# Patient Record
Sex: Female | Born: 1965 | Race: Black or African American | Hispanic: No | Marital: Single | Smoking: Never smoker
Health system: Southern US, Community
[De-identification: ages and names within clinical notes are randomized; demographics above are authoritative.]

## PROBLEM LIST (undated history)

## (undated) DIAGNOSIS — E079 Disorder of thyroid, unspecified: Secondary | ICD-10-CM

## (undated) HISTORY — PX: THYROID SURGERY: SHX805

## (undated) HISTORY — PX: BRAIN SURGERY: SHX531

## (undated) HISTORY — PX: KNEE SURGERY: SHX244

---

## 2000-08-06 ENCOUNTER — Other Ambulatory Visit: Admission: RE | Admit: 2000-08-06 | Discharge: 2000-08-06 | Payer: Self-pay | Admitting: Internal Medicine

## 2000-08-15 ENCOUNTER — Encounter: Admission: RE | Admit: 2000-08-15 | Discharge: 2000-08-15 | Payer: Self-pay | Admitting: Internal Medicine

## 2000-08-15 ENCOUNTER — Encounter: Payer: Self-pay | Admitting: Internal Medicine

## 2001-08-12 ENCOUNTER — Encounter: Admission: RE | Admit: 2001-08-12 | Discharge: 2001-08-12 | Payer: Self-pay | Admitting: Internal Medicine

## 2001-08-12 ENCOUNTER — Encounter: Payer: Self-pay | Admitting: Internal Medicine

## 2001-08-29 ENCOUNTER — Encounter (INDEPENDENT_AMBULATORY_CARE_PROVIDER_SITE_OTHER): Payer: Self-pay | Admitting: *Deleted

## 2001-08-29 ENCOUNTER — Encounter: Payer: Self-pay | Admitting: Internal Medicine

## 2001-08-29 ENCOUNTER — Ambulatory Visit (HOSPITAL_COMMUNITY): Admission: RE | Admit: 2001-08-29 | Discharge: 2001-08-29 | Payer: Self-pay | Admitting: Internal Medicine

## 2005-01-27 ENCOUNTER — Other Ambulatory Visit: Admission: RE | Admit: 2005-01-27 | Discharge: 2005-01-27 | Payer: Self-pay | Admitting: Internal Medicine

## 2005-02-03 ENCOUNTER — Ambulatory Visit (HOSPITAL_COMMUNITY): Admission: RE | Admit: 2005-02-03 | Discharge: 2005-02-03 | Payer: Self-pay | Admitting: Internal Medicine

## 2005-09-20 ENCOUNTER — Encounter: Admission: RE | Admit: 2005-09-20 | Discharge: 2005-09-20 | Payer: Self-pay | Admitting: Family Medicine

## 2006-08-24 ENCOUNTER — Other Ambulatory Visit: Admission: RE | Admit: 2006-08-24 | Discharge: 2006-08-24 | Payer: Self-pay | Admitting: Obstetrics and Gynecology

## 2006-12-28 ENCOUNTER — Encounter: Admission: RE | Admit: 2006-12-28 | Discharge: 2006-12-28 | Payer: Self-pay | Admitting: Family Medicine

## 2007-06-28 ENCOUNTER — Encounter: Admission: RE | Admit: 2007-06-28 | Discharge: 2007-06-28 | Payer: Self-pay | Admitting: Family Medicine

## 2007-07-11 ENCOUNTER — Other Ambulatory Visit: Admission: RE | Admit: 2007-07-11 | Discharge: 2007-07-11 | Payer: Self-pay | Admitting: Interventional Radiology

## 2007-07-11 ENCOUNTER — Encounter (INDEPENDENT_AMBULATORY_CARE_PROVIDER_SITE_OTHER): Payer: Self-pay | Admitting: Interventional Radiology

## 2007-07-11 ENCOUNTER — Encounter: Admission: RE | Admit: 2007-07-11 | Discharge: 2007-07-11 | Payer: Self-pay | Admitting: Family Medicine

## 2007-10-08 ENCOUNTER — Encounter (HOSPITAL_COMMUNITY): Admission: RE | Admit: 2007-10-08 | Discharge: 2007-12-18 | Payer: Self-pay | Admitting: Internal Medicine

## 2008-01-30 ENCOUNTER — Ambulatory Visit (HOSPITAL_COMMUNITY): Admission: RE | Admit: 2008-01-30 | Discharge: 2008-01-31 | Payer: Self-pay | Admitting: Surgery

## 2008-01-30 ENCOUNTER — Encounter (INDEPENDENT_AMBULATORY_CARE_PROVIDER_SITE_OTHER): Payer: Self-pay | Admitting: Surgery

## 2010-06-17 ENCOUNTER — Other Ambulatory Visit: Payer: Self-pay | Admitting: Family Medicine

## 2010-06-17 ENCOUNTER — Other Ambulatory Visit: Payer: Self-pay

## 2010-06-17 ENCOUNTER — Other Ambulatory Visit (HOSPITAL_COMMUNITY)
Admission: RE | Admit: 2010-06-17 | Discharge: 2010-06-17 | Disposition: A | Discharge status: Home / Self Care | Payer: PRIVATE HEALTH INSURANCE | Source: Ambulatory Visit | Attending: Internal Medicine | Admitting: Internal Medicine

## 2010-06-17 DIAGNOSIS — Z1231 Encounter for screening mammogram for malignant neoplasm of breast: Secondary | ICD-10-CM

## 2010-06-17 DIAGNOSIS — Z01419 Encounter for gynecological examination (general) (routine) without abnormal findings: Secondary | ICD-10-CM | POA: Insufficient documentation

## 2010-06-24 ENCOUNTER — Ambulatory Visit
Admission: RE | Admit: 2010-06-24 | Discharge: 2010-06-24 | Disposition: A | Discharge status: Home / Self Care | Payer: PRIVATE HEALTH INSURANCE | Source: Ambulatory Visit | Attending: Family Medicine | Admitting: Family Medicine

## 2010-06-24 DIAGNOSIS — Z1231 Encounter for screening mammogram for malignant neoplasm of breast: Secondary | ICD-10-CM

## 2010-06-28 NOTE — Op Note (Signed)
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   PROCEDURE:  Left thyroid lobectomy.   SURGEON:  Velora Heckler, MD, FACS   ASSISTANT:  Leonie Man, MD   ANESTHESIA:  General per Dr. Laverle Hobby.   ESTIMATED BLOOD LOSS:  Minimal.   PREPARATION:  Betadine.   COMPLICATIONS:  None.   INDICATIONS:  The patient is a 45 year old black female who presents at  the request of Dr. Talmage Coin with left-sided thyroid nodule.  The  patient had initially been found to have a nodule in 2002.  Suppressive  doses of Synthroid were poorly tolerated and discontinued in 2005.  Ultrasound in May 2009 showed a 2.7-cm nodule in the left thyroid lobe.  Fine-needle aspiration biopsy showed Hurthle cell change.  Nuclear scan  showed a cold nodule in the left lobe.  The patient is referred to  Surgery for resection for definitive diagnosis.   BODY OF REPORT:  Procedure was done in OR #60 at the [facility].  The patient was brought to the operating room and  placed in the supine position on the operating room table.  Following  administration of general anesthesia, the patient was positioned and  then prepped and draped in the usual strict aseptic fashion.  After  ascertaining that an adequate level of anesthesia had been achieved, a  Kocher incision was performed with a #15 blade.  Dissection was carried  through subcutaneous tissues and platysma.  Hemostasis was obtained with  electrocautery.  Skin flaps were elevated cephalad and  caudad from the  thyroid notch to the sternal notch.  A Mahorner self-retaining retractor  was placed for exposure.  Strap muscles were incised in the midline and  dissection was carried down to the isthmus of the thyroid.  Strap  muscles were reflected to the left and the left thyroid lobe was  exposed.  Venous tributaries were divided between Ligaclips.  Gland was  gently mobilized.  Superior pole vessels were ligated in continuity with  2-0 silk ties and divided.  Smaller vessels were divided between  Ligaclips.  Gland was rolled anteriorly.  Inferior venous tributaries  were ligated with 2-0 silk ties and divided.  Gland was rolled  anteriorly.  Inferior parathyroid gland was identified and preserved on  its vascular pedicle.  Branches of the inferior thyroid artery were  divided between small Ligaclips.  Gland was rolled further anteriorly.  Ligament of Allyson Sabal was transected with electrocautery and the gland was  rolled up and onto the anterior surface of the trachea.  It was  mobilized across the midline.  Isthmus was transected at its junction  with the right thyroid lobe between hemostats.  Specimen was submitted  to Pathology for frozen section.  Dr. Pecola Leisure reviewed the specimen  and felt that this was a follicular lesion.  Hurthle cell changes noted.  No obvious malignancy was identified.   The cut edge of the right lobe of the thyroid was suture-ligated with 3-  0 Vicryl suture ligatures.  A small lymph node line just above the  isthmus was excised.  This was labeled as a Delphian lymph node and  submitted separately to pathology for review.  Right thyroid lobe was  examined and appeared grossly normal.  On palpation, there were no  dominant nor discrete masses.  There was no other lymphadenopathy.   Wound was irrigated.  Good hemostasis was noted.  Surgicel was placed in  the operative field.  Strap muscles were reapproximated in the midline  with interrupted 3-0 Vicryl  sutures.  Platysma was closed with  interrupted 3-0 Vicryl sutures.  Skin was closed with running 4-0  Monocryl subcuticular suture.  Wound was washed and dried.  Benzoin and  Steri-Strips were applied.  Sterile dressings were applied.  The patient  was awakened from anesthesia and brought to the recovery room in stable  condition.  The patient tolerated the procedure well.      Velora Heckler, MD  Electronically Signed     TMG/MEDQ  D:  01/30/2008  T:  01/30/2008  Job:  [id number]   cc:   Talmage Coin, M.D.  Lavonda Jumbo, M.D.

## 2010-11-18 LAB — URINALYSIS, ROUTINE W REFLEX MICROSCOPIC
Bilirubin Urine: NEGATIVE
Hgb urine dipstick: NEGATIVE
Nitrite: NEGATIVE
Protein, ur: NEGATIVE mg/dL
Specific Gravity, Urine: 1.02 (ref 1.005–1.030)
Urobilinogen, UA: 0.2 mg/dL (ref 0.0–1.0)

## 2010-11-18 LAB — DIFFERENTIAL
Band Neutrophils: 0 % (ref 0–10)
Blasts: 0 %
Eosinophils Absolute: 0.1 10*3/uL (ref 0.0–0.7)
Eosinophils Relative: 1 % (ref 0–5)
Metamyelocytes Relative: 0 %
Monocytes Absolute: 0.3 10*3/uL (ref 0.1–1.0)
Myelocytes: 0 %
nRBC: 0 /100 WBC

## 2010-11-18 LAB — COMPREHENSIVE METABOLIC PANEL
ALT: 24 U/L (ref 0–35)
AST: 25 U/L (ref 0–37)
Alkaline Phosphatase: 85 U/L (ref 39–117)
CO2: 26 mEq/L (ref 19–32)
GFR calc Af Amer: >60 mL/min (ref >60)
GFR calc non Af Amer: >60 mL/min (ref >60)
Glucose, Bld: 99 mg/dL (ref 70–99)
Potassium: 3.8 mEq/L (ref 3.5–5.1)
Sodium: 139 mEq/L (ref 135–145)

## 2010-11-18 LAB — HCG, SERUM, QUALITATIVE: Preg, Serum: NEGATIVE

## 2010-11-18 LAB — CBC
Hemoglobin: 12.8 g/dL (ref 12.0–15.0)
RBC: 4.68 MIL/uL (ref 3.87–5.11)
WBC: 4.9 10*3/uL (ref 4.0–10.5)

## 2010-11-18 LAB — PROTIME-INR
INR: 1 (ref 0.00–1.49)
Prothrombin Time: 13.3 seconds (ref 11.6–15.2)

## 2013-08-19 ENCOUNTER — Ambulatory Visit: Payer: PRIVATE HEALTH INSURANCE | Admitting: Internal Medicine

## 2013-11-17 ENCOUNTER — Emergency Department: Payer: Self-pay | Admitting: Emergency Medicine

## 2013-11-17 LAB — COMPREHENSIVE METABOLIC PANEL
ALT: 18 U/L
AST: 17 U/L (ref 15–37)
Albumin: 3.7 g/dL (ref 3.4–5.0)
Alkaline Phosphatase: 82 U/L
Anion Gap: 7 (ref 7–16)
BILIRUBIN TOTAL: 0.2 mg/dL (ref 0.2–1.0)
BUN: 9 mg/dL (ref 7–18)
CHLORIDE: 105 mmol/L (ref 98–107)
CO2: 25 mmol/L (ref 21–32)
Calcium, Total: 8.4 mg/dL — ABNORMAL LOW (ref 8.5–10.1)
Creatinine: 0.84 mg/dL (ref 0.60–1.30)
EGFR (African American): >60
EGFR (Non-African Amer.): >60
Glucose: 115 mg/dL — ABNORMAL HIGH (ref 65–99)
Osmolality: 273 (ref 275–301)
POTASSIUM: 3.5 mmol/L (ref 3.5–5.1)
SODIUM: 137 mmol/L (ref 136–145)
TOTAL PROTEIN: 7.3 g/dL (ref 6.4–8.2)

## 2013-11-17 LAB — CK TOTAL AND CKMB (NOT AT ARMC)
CK, Total: 102 U/L
CK-MB: 0.7 ng/mL (ref 0.5–3.6)

## 2013-11-17 LAB — CBC
HCT: 39.1 % (ref 35.0–47.0)
HGB: 12.4 g/dL (ref 12.0–16.0)
MCH: 26.6 pg (ref 26.0–34.0)
MCHC: 31.9 g/dL — ABNORMAL LOW (ref 32.0–36.0)
MCV: 84 fL (ref 80–100)
Platelet: 346 10*3/uL (ref 150–440)
RBC: 4.68 10*6/uL (ref 3.80–5.20)
RDW: 14.9 % — AB (ref 11.5–14.5)
WBC: 10.5 10*3/uL (ref 3.6–11.0)

## 2013-11-17 LAB — LIPASE, BLOOD: LIPASE: 86 U/L (ref 73–393)

## 2013-11-17 LAB — TROPONIN I

## 2013-11-18 LAB — URINALYSIS, COMPLETE
Bacteria: NONE SEEN
Bilirubin,UR: NEGATIVE
Blood: NEGATIVE
GLUCOSE, UR: NEGATIVE mg/dL (ref 0–75)
Leukocyte Esterase: NEGATIVE
Nitrite: NEGATIVE
Ph: 5 (ref 4.5–8.0)
SPECIFIC GRAVITY: 1.021 (ref 1.003–1.030)

## 2013-11-18 LAB — TROPONIN I: Troponin-I: <0.02 ng/mL

## 2015-07-30 DIAGNOSIS — M12271 Villonodular synovitis (pigmented), right ankle and foot: Secondary | ICD-10-CM | POA: Diagnosis not present

## 2015-07-30 DIAGNOSIS — M65871 Other synovitis and tenosynovitis, right ankle and foot: Secondary | ICD-10-CM | POA: Diagnosis not present

## 2015-07-30 DIAGNOSIS — M25571 Pain in right ankle and joints of right foot: Secondary | ICD-10-CM | POA: Diagnosis not present

## 2015-07-30 DIAGNOSIS — M79671 Pain in right foot: Secondary | ICD-10-CM | POA: Diagnosis not present

## 2015-08-13 DIAGNOSIS — M65871 Other synovitis and tenosynovitis, right ankle and foot: Secondary | ICD-10-CM | POA: Diagnosis not present

## 2015-08-13 DIAGNOSIS — M25571 Pain in right ankle and joints of right foot: Secondary | ICD-10-CM | POA: Diagnosis not present

## 2015-09-03 DIAGNOSIS — M12271 Villonodular synovitis (pigmented), right ankle and foot: Secondary | ICD-10-CM | POA: Diagnosis not present

## 2015-09-03 DIAGNOSIS — M65871 Other synovitis and tenosynovitis, right ankle and foot: Secondary | ICD-10-CM | POA: Diagnosis not present

## 2015-09-14 DIAGNOSIS — S83242A Other tear of medial meniscus, current injury, left knee, initial encounter: Secondary | ICD-10-CM | POA: Diagnosis not present

## 2015-09-14 DIAGNOSIS — M25562 Pain in left knee: Secondary | ICD-10-CM | POA: Diagnosis not present

## 2015-11-22 DIAGNOSIS — J029 Acute pharyngitis, unspecified: Secondary | ICD-10-CM | POA: Diagnosis not present

## 2015-12-23 DIAGNOSIS — M25561 Pain in right knee: Secondary | ICD-10-CM | POA: Diagnosis not present

## 2015-12-24 DIAGNOSIS — E559 Vitamin D deficiency, unspecified: Secondary | ICD-10-CM | POA: Diagnosis not present

## 2015-12-24 DIAGNOSIS — K219 Gastro-esophageal reflux disease without esophagitis: Secondary | ICD-10-CM | POA: Diagnosis not present

## 2015-12-24 DIAGNOSIS — J3089 Other allergic rhinitis: Secondary | ICD-10-CM | POA: Diagnosis not present

## 2015-12-24 DIAGNOSIS — D473 Essential (hemorrhagic) thrombocythemia: Secondary | ICD-10-CM | POA: Diagnosis not present

## 2015-12-24 DIAGNOSIS — Z8639 Personal history of other endocrine, nutritional and metabolic disease: Secondary | ICD-10-CM | POA: Diagnosis not present

## 2015-12-30 DIAGNOSIS — M1711 Unilateral primary osteoarthritis, right knee: Secondary | ICD-10-CM | POA: Diagnosis not present

## 2015-12-30 DIAGNOSIS — M25561 Pain in right knee: Secondary | ICD-10-CM | POA: Diagnosis not present

## 2015-12-30 DIAGNOSIS — M25461 Effusion, right knee: Secondary | ICD-10-CM | POA: Diagnosis not present

## 2016-01-20 DIAGNOSIS — M25561 Pain in right knee: Secondary | ICD-10-CM | POA: Diagnosis not present

## 2016-02-09 DIAGNOSIS — M1711 Unilateral primary osteoarthritis, right knee: Secondary | ICD-10-CM | POA: Diagnosis not present

## 2016-02-09 DIAGNOSIS — M25561 Pain in right knee: Secondary | ICD-10-CM | POA: Diagnosis not present

## 2016-02-25 DIAGNOSIS — B9789 Other viral agents as the cause of diseases classified elsewhere: Secondary | ICD-10-CM | POA: Diagnosis not present

## 2016-02-25 DIAGNOSIS — J040 Acute laryngitis: Secondary | ICD-10-CM | POA: Diagnosis not present

## 2016-02-29 ENCOUNTER — Emergency Department (HOSPITAL_COMMUNITY)
Admission: EM | Admit: 2016-02-29 | Discharge: 2016-02-29 | Disposition: A | Discharge status: Home / Self Care | Payer: BLUE CROSS/BLUE SHIELD | Attending: Emergency Medicine | Admitting: Emergency Medicine

## 2016-02-29 ENCOUNTER — Encounter (HOSPITAL_COMMUNITY): Payer: Self-pay

## 2016-02-29 ENCOUNTER — Emergency Department (HOSPITAL_COMMUNITY): Payer: BLUE CROSS/BLUE SHIELD

## 2016-02-29 DIAGNOSIS — J069 Acute upper respiratory infection, unspecified: Secondary | ICD-10-CM | POA: Insufficient documentation

## 2016-02-29 DIAGNOSIS — B9789 Other viral agents as the cause of diseases classified elsewhere: Secondary | ICD-10-CM

## 2016-02-29 DIAGNOSIS — R05 Cough: Secondary | ICD-10-CM | POA: Diagnosis not present

## 2016-02-29 HISTORY — DX: Disorder of thyroid, unspecified: E07.9

## 2016-02-29 MED ORDER — HYDROCOD POLST-CPM POLST ER 10-8 MG/5ML PO SUER: 5.0000 mL | Freq: Every evening | ORAL | 0 refills | Status: DC | PRN

## 2016-02-29 MED ORDER — IPRATROPIUM-ALBUTEROL 0.5-2.5 (3) MG/3ML IN SOLN
3.0000 mL | Freq: Once | RESPIRATORY_TRACT | Status: AC
  Administered 2016-02-29: 3 mL via RESPIRATORY_TRACT
  Filled 2016-02-29: qty 3

## 2016-02-29 NOTE — ED Provider Notes (Signed)
[facility]EMERGENCY DEPT Provider Note   CSN: [id number] Arrival date & time: 02/29/16  6578 By signing my name below, I, Bridgette Habermann, attest that this documentation has been prepared under the direction and in the presence of Zadie Rhine, MD. Electronically Signed: Bridgette Habermann, ED Scribe. 02/29/16. 3:56 AM.  History   Chief Complaint Chief Complaint  Patient presents with  . Cough    HPI  The history is provided by the patient. No language interpreter was used.  Cough  This is a new problem. The current episode started more than 1 week ago. The problem occurs constantly. The problem has not changed since onset.The cough is productive of sputum. There has been no fever. Associated symptoms include sore throat. Pertinent negatives include no chest pain. She has tried cough syrup for the symptoms. The treatment provided no relief. She is not a smoker.    HPI Comments: Kendra [name] is a 51 y.o. female with no pertinent PMHx, who presents to the Emergency Department complaining of productive cough onset one week ago with associated sore throat. Pt reports she was seen by her PCP and diagnosed with viral laryngitis. She was prescribed Tessalon and Prednisone with no relief to her symptoms. Pt is not a smoker and does not drink alcohol. Pt denies fever, chills, hemoptysis, vomiting, chest pain, leg swelling, abdominal pain, or any other associated symptoms.    Past Medical History:  Diagnosis Date  . Thyroid disease     There are no active problems to display for this patient.   History reviewed. No pertinent surgical history.  OB History    No data available       Home Medications    Prior to Admission medications   Not on File    Family History History reviewed. No pertinent family history.  Social History Social History  Substance Use Topics  . Smoking status: Never Smoker  . Smokeless tobacco: Never Used  . Alcohol use No     Allergies   Tea   Review of  Systems Review of Systems  HENT: Positive for sore throat.   Respiratory: Positive for cough.   Cardiovascular: Negative for chest pain.  All other systems reviewed and are negative.   Physical Exam Updated Vital Signs BP 139/86   Pulse 93   Temp 98.3 F (36.8 C) (Oral)   Resp 16   LMP 11/29/2015 (Approximate)   SpO2 99%   Physical Exam CONSTITUTIONAL: Well developed/well nourished HEAD: Normocephalic/atraumatic EYES: EOMI/PERRL ENMT: Mucous membranes moist. Uvula midline, no erythema or exudates noted. NECK: supple no meningeal signs SPINE/BACK:entire spine nontender CV: S1/S2 noted, no murmurs/rubs/gallops noted LUNGS: Lungs are clear to auscultation bilaterally, no apparent distress. Pt coughs frequently on exam. ABDOMEN: soft, nontender GU:no cva tenderness NEURO: Pt is awake/alert/appropriate, moves all extremitiesx4.  No facial droop.   EXTREMITIES: pulses normal/equal, full ROM SKIN: warm, color normal PSYCH: no abnormalities of mood noted, alert and oriented to situation  ED Treatments / Results  DIAGNOSTIC STUDIES: Oxygen Saturation is 99% on RA, normal by my interpretation.    COORDINATION OF CARE: 3:53 AM Discussed treatment plan with pt at bedside which includes x-ray and breathing treatment and pt agreed to plan.  Labs (all labs ordered are listed, but only abnormal results are displayed) Labs Reviewed - No data to display  EKG  EKG Interpretation None       Radiology Dg Chest 2 View  Result Date: 02/29/2016 CLINICAL DATA:  Cough for 2 weeks. EXAM: CHEST  2 VIEW COMPARISON:  11/17/2013 FINDINGS: Low lung volumes with bibasilar atelectasis or scarring. No focal consolidation to suggest pneumonia. Normal heart size and mediastinal contours. No pleural fluid or pneumothorax. No acute osseous abnormality. IMPRESSION: Low lung volumes with bibasilar atelectasis or scarring. Electronically Signed   By: Rubye OaksMelanie  Ehinger M.D.   On: 02/29/2016 04:29     Procedures Procedures   Medications Ordered in ED Medications  ipratropium-albuterol (DUONEB) 0.5-2.5 (3) MG/3ML nebulizer solution 3 mL (3 mLs Nebulization Given 02/29/16 0406)     Initial Impression / Assessment and Plan / ED Course  I have reviewed the triage vital signs and the nursing notes.  Pertinent imaging results that were available during my care of the patient were reviewed by me and considered in my medical decision making (see chart for details).  Clinical Course    5:14 AM Pt well appearing No hypoxia CXR negative She coughs frequently but otherwise well appearing No distress noted Suspect prolonged viral URI with cough Stop tessalon, start tussionex Narcotic database reviewed and considered in decision making She will see PCP later this week if cough continues We discussed return precautions   Final Clinical Impressions(s) / ED Diagnoses   Final diagnoses:  Viral URI with cough    New Prescriptions New Prescriptions   CHLORPHENIRAMINE-HYDROCODONE (TUSSIONEX PENNKINETIC ER) 10-8 MG/5ML SUER    Take 5 mLs by mouth at bedtime as needed for cough.   I personally performed the services described in this documentation, which was scribed in my presence. The recorded information has been reviewed and is accurate.        Zadie Rhineonald Dawud Mays, MD 02/29/16 [phone]

## 2016-02-29 NOTE — ED Triage Notes (Signed)
Pt states coughing x 1 week. Pt states seen by PCP dx with viral laryngitis. Pt states rx for tessalon. Pt states cough continued, no relief. Pt denies any fevers or chills.

## 2016-03-03 DIAGNOSIS — J019 Acute sinusitis, unspecified: Secondary | ICD-10-CM | POA: Diagnosis not present

## 2016-03-03 DIAGNOSIS — B9689 Other specified bacterial agents as the cause of diseases classified elsewhere: Secondary | ICD-10-CM | POA: Diagnosis not present

## 2016-03-21 DIAGNOSIS — M1711 Unilateral primary osteoarthritis, right knee: Secondary | ICD-10-CM | POA: Diagnosis not present

## 2016-03-28 DIAGNOSIS — M1711 Unilateral primary osteoarthritis, right knee: Secondary | ICD-10-CM | POA: Diagnosis not present

## 2016-04-05 DIAGNOSIS — M1711 Unilateral primary osteoarthritis, right knee: Secondary | ICD-10-CM | POA: Diagnosis not present

## 2016-05-04 DIAGNOSIS — J3081 Allergic rhinitis due to animal (cat) (dog) hair and dander: Secondary | ICD-10-CM | POA: Diagnosis not present

## 2016-05-04 DIAGNOSIS — J301 Allergic rhinitis due to pollen: Secondary | ICD-10-CM | POA: Diagnosis not present

## 2016-05-04 DIAGNOSIS — J3089 Other allergic rhinitis: Secondary | ICD-10-CM | POA: Diagnosis not present

## 2016-05-04 DIAGNOSIS — R05 Cough: Secondary | ICD-10-CM | POA: Diagnosis not present

## 2016-05-10 DIAGNOSIS — J301 Allergic rhinitis due to pollen: Secondary | ICD-10-CM | POA: Diagnosis not present

## 2016-05-10 DIAGNOSIS — J3081 Allergic rhinitis due to animal (cat) (dog) hair and dander: Secondary | ICD-10-CM | POA: Diagnosis not present

## 2016-05-11 DIAGNOSIS — J3089 Other allergic rhinitis: Secondary | ICD-10-CM | POA: Diagnosis not present

## 2016-05-26 DIAGNOSIS — L659 Nonscarring hair loss, unspecified: Secondary | ICD-10-CM | POA: Diagnosis not present

## 2016-05-26 DIAGNOSIS — L259 Unspecified contact dermatitis, unspecified cause: Secondary | ICD-10-CM | POA: Diagnosis not present

## 2016-06-21 DIAGNOSIS — J301 Allergic rhinitis due to pollen: Secondary | ICD-10-CM | POA: Diagnosis not present

## 2016-06-21 DIAGNOSIS — J3089 Other allergic rhinitis: Secondary | ICD-10-CM | POA: Diagnosis not present

## 2016-06-21 DIAGNOSIS — J3081 Allergic rhinitis due to animal (cat) (dog) hair and dander: Secondary | ICD-10-CM | POA: Diagnosis not present

## 2016-06-28 DIAGNOSIS — J301 Allergic rhinitis due to pollen: Secondary | ICD-10-CM | POA: Diagnosis not present

## 2016-06-28 DIAGNOSIS — J3081 Allergic rhinitis due to animal (cat) (dog) hair and dander: Secondary | ICD-10-CM | POA: Diagnosis not present

## 2016-06-28 DIAGNOSIS — J3089 Other allergic rhinitis: Secondary | ICD-10-CM | POA: Diagnosis not present

## 2016-07-05 DIAGNOSIS — J3089 Other allergic rhinitis: Secondary | ICD-10-CM | POA: Diagnosis not present

## 2016-07-05 DIAGNOSIS — J3081 Allergic rhinitis due to animal (cat) (dog) hair and dander: Secondary | ICD-10-CM | POA: Diagnosis not present

## 2016-07-05 DIAGNOSIS — J301 Allergic rhinitis due to pollen: Secondary | ICD-10-CM | POA: Diagnosis not present

## 2016-07-11 DIAGNOSIS — M1711 Unilateral primary osteoarthritis, right knee: Secondary | ICD-10-CM | POA: Diagnosis not present

## 2016-07-12 DIAGNOSIS — J3081 Allergic rhinitis due to animal (cat) (dog) hair and dander: Secondary | ICD-10-CM | POA: Diagnosis not present

## 2016-07-12 DIAGNOSIS — J3089 Other allergic rhinitis: Secondary | ICD-10-CM | POA: Diagnosis not present

## 2016-07-12 DIAGNOSIS — J301 Allergic rhinitis due to pollen: Secondary | ICD-10-CM | POA: Diagnosis not present

## 2016-07-26 DIAGNOSIS — J3081 Allergic rhinitis due to animal (cat) (dog) hair and dander: Secondary | ICD-10-CM | POA: Diagnosis not present

## 2016-07-26 DIAGNOSIS — J3089 Other allergic rhinitis: Secondary | ICD-10-CM | POA: Diagnosis not present

## 2016-07-26 DIAGNOSIS — J301 Allergic rhinitis due to pollen: Secondary | ICD-10-CM | POA: Diagnosis not present

## 2016-08-02 DIAGNOSIS — J301 Allergic rhinitis due to pollen: Secondary | ICD-10-CM | POA: Diagnosis not present

## 2016-08-02 DIAGNOSIS — J3081 Allergic rhinitis due to animal (cat) (dog) hair and dander: Secondary | ICD-10-CM | POA: Diagnosis not present

## 2016-08-02 DIAGNOSIS — J3089 Other allergic rhinitis: Secondary | ICD-10-CM | POA: Diagnosis not present

## 2016-08-09 DIAGNOSIS — J3089 Other allergic rhinitis: Secondary | ICD-10-CM | POA: Diagnosis not present

## 2016-08-09 DIAGNOSIS — J301 Allergic rhinitis due to pollen: Secondary | ICD-10-CM | POA: Diagnosis not present

## 2016-08-09 DIAGNOSIS — J3081 Allergic rhinitis due to animal (cat) (dog) hair and dander: Secondary | ICD-10-CM | POA: Diagnosis not present

## 2016-08-23 DIAGNOSIS — J3089 Other allergic rhinitis: Secondary | ICD-10-CM | POA: Diagnosis not present

## 2016-08-23 DIAGNOSIS — J301 Allergic rhinitis due to pollen: Secondary | ICD-10-CM | POA: Diagnosis not present

## 2016-08-23 DIAGNOSIS — J3081 Allergic rhinitis due to animal (cat) (dog) hair and dander: Secondary | ICD-10-CM | POA: Diagnosis not present

## 2016-08-30 DIAGNOSIS — J301 Allergic rhinitis due to pollen: Secondary | ICD-10-CM | POA: Diagnosis not present

## 2016-08-30 DIAGNOSIS — J3089 Other allergic rhinitis: Secondary | ICD-10-CM | POA: Diagnosis not present

## 2016-08-30 DIAGNOSIS — J3081 Allergic rhinitis due to animal (cat) (dog) hair and dander: Secondary | ICD-10-CM | POA: Diagnosis not present

## 2016-09-01 DIAGNOSIS — D473 Essential (hemorrhagic) thrombocythemia: Secondary | ICD-10-CM | POA: Diagnosis not present

## 2016-09-01 DIAGNOSIS — Z8639 Personal history of other endocrine, nutritional and metabolic disease: Secondary | ICD-10-CM | POA: Diagnosis not present

## 2016-09-01 DIAGNOSIS — K219 Gastro-esophageal reflux disease without esophagitis: Secondary | ICD-10-CM | POA: Diagnosis not present

## 2016-09-01 DIAGNOSIS — R011 Cardiac murmur, unspecified: Secondary | ICD-10-CM | POA: Diagnosis not present

## 2016-09-01 DIAGNOSIS — E559 Vitamin D deficiency, unspecified: Secondary | ICD-10-CM | POA: Diagnosis not present

## 2016-09-13 DIAGNOSIS — J301 Allergic rhinitis due to pollen: Secondary | ICD-10-CM | POA: Diagnosis not present

## 2016-09-13 DIAGNOSIS — J3089 Other allergic rhinitis: Secondary | ICD-10-CM | POA: Diagnosis not present

## 2016-09-13 DIAGNOSIS — J3081 Allergic rhinitis due to animal (cat) (dog) hair and dander: Secondary | ICD-10-CM | POA: Diagnosis not present

## 2016-09-20 DIAGNOSIS — J3081 Allergic rhinitis due to animal (cat) (dog) hair and dander: Secondary | ICD-10-CM | POA: Diagnosis not present

## 2016-09-20 DIAGNOSIS — J3089 Other allergic rhinitis: Secondary | ICD-10-CM | POA: Diagnosis not present

## 2016-09-20 DIAGNOSIS — J301 Allergic rhinitis due to pollen: Secondary | ICD-10-CM | POA: Diagnosis not present

## 2016-09-27 DIAGNOSIS — J3089 Other allergic rhinitis: Secondary | ICD-10-CM | POA: Diagnosis not present

## 2016-09-27 DIAGNOSIS — J301 Allergic rhinitis due to pollen: Secondary | ICD-10-CM | POA: Diagnosis not present

## 2016-09-27 DIAGNOSIS — J3081 Allergic rhinitis due to animal (cat) (dog) hair and dander: Secondary | ICD-10-CM | POA: Diagnosis not present

## 2016-10-04 DIAGNOSIS — J301 Allergic rhinitis due to pollen: Secondary | ICD-10-CM | POA: Diagnosis not present

## 2016-10-04 DIAGNOSIS — J3081 Allergic rhinitis due to animal (cat) (dog) hair and dander: Secondary | ICD-10-CM | POA: Diagnosis not present

## 2016-10-04 DIAGNOSIS — J3089 Other allergic rhinitis: Secondary | ICD-10-CM | POA: Diagnosis not present

## 2016-10-11 DIAGNOSIS — M1711 Unilateral primary osteoarthritis, right knee: Secondary | ICD-10-CM | POA: Diagnosis not present

## 2016-10-18 DIAGNOSIS — J3081 Allergic rhinitis due to animal (cat) (dog) hair and dander: Secondary | ICD-10-CM | POA: Diagnosis not present

## 2016-10-18 DIAGNOSIS — J3089 Other allergic rhinitis: Secondary | ICD-10-CM | POA: Diagnosis not present

## 2016-10-18 DIAGNOSIS — J301 Allergic rhinitis due to pollen: Secondary | ICD-10-CM | POA: Diagnosis not present

## 2016-10-25 DIAGNOSIS — J301 Allergic rhinitis due to pollen: Secondary | ICD-10-CM | POA: Diagnosis not present

## 2016-10-25 DIAGNOSIS — J3081 Allergic rhinitis due to animal (cat) (dog) hair and dander: Secondary | ICD-10-CM | POA: Diagnosis not present

## 2016-10-25 DIAGNOSIS — J3089 Other allergic rhinitis: Secondary | ICD-10-CM | POA: Diagnosis not present

## 2016-11-01 DIAGNOSIS — J3089 Other allergic rhinitis: Secondary | ICD-10-CM | POA: Diagnosis not present

## 2016-11-01 DIAGNOSIS — J301 Allergic rhinitis due to pollen: Secondary | ICD-10-CM | POA: Diagnosis not present

## 2016-11-01 DIAGNOSIS — J3081 Allergic rhinitis due to animal (cat) (dog) hair and dander: Secondary | ICD-10-CM | POA: Diagnosis not present

## 2016-11-15 DIAGNOSIS — J3081 Allergic rhinitis due to animal (cat) (dog) hair and dander: Secondary | ICD-10-CM | POA: Diagnosis not present

## 2016-11-15 DIAGNOSIS — J3089 Other allergic rhinitis: Secondary | ICD-10-CM | POA: Diagnosis not present

## 2016-11-15 DIAGNOSIS — J301 Allergic rhinitis due to pollen: Secondary | ICD-10-CM | POA: Diagnosis not present

## 2016-11-24 DIAGNOSIS — M1711 Unilateral primary osteoarthritis, right knee: Secondary | ICD-10-CM | POA: Diagnosis not present

## 2016-11-24 DIAGNOSIS — M25561 Pain in right knee: Secondary | ICD-10-CM | POA: Diagnosis not present

## 2016-12-19 DIAGNOSIS — J3089 Other allergic rhinitis: Secondary | ICD-10-CM | POA: Diagnosis not present

## 2016-12-19 DIAGNOSIS — R05 Cough: Secondary | ICD-10-CM | POA: Diagnosis not present

## 2016-12-19 DIAGNOSIS — J301 Allergic rhinitis due to pollen: Secondary | ICD-10-CM | POA: Diagnosis not present

## 2016-12-19 DIAGNOSIS — J3081 Allergic rhinitis due to animal (cat) (dog) hair and dander: Secondary | ICD-10-CM | POA: Diagnosis not present

## 2016-12-21 DIAGNOSIS — M1711 Unilateral primary osteoarthritis, right knee: Secondary | ICD-10-CM | POA: Diagnosis not present

## 2016-12-22 DIAGNOSIS — Z471 Aftercare following joint replacement surgery: Secondary | ICD-10-CM | POA: Diagnosis not present

## 2016-12-22 DIAGNOSIS — M25561 Pain in right knee: Secondary | ICD-10-CM | POA: Diagnosis not present

## 2016-12-22 DIAGNOSIS — M1711 Unilateral primary osteoarthritis, right knee: Secondary | ICD-10-CM | POA: Diagnosis not present

## 2016-12-25 DIAGNOSIS — M1711 Unilateral primary osteoarthritis, right knee: Secondary | ICD-10-CM | POA: Diagnosis not present

## 2016-12-25 DIAGNOSIS — M25561 Pain in right knee: Secondary | ICD-10-CM | POA: Diagnosis not present

## 2016-12-25 DIAGNOSIS — Z471 Aftercare following joint replacement surgery: Secondary | ICD-10-CM | POA: Diagnosis not present

## 2016-12-27 DIAGNOSIS — Z471 Aftercare following joint replacement surgery: Secondary | ICD-10-CM | POA: Diagnosis not present

## 2016-12-27 DIAGNOSIS — M1711 Unilateral primary osteoarthritis, right knee: Secondary | ICD-10-CM | POA: Diagnosis not present

## 2016-12-27 DIAGNOSIS — M25561 Pain in right knee: Secondary | ICD-10-CM | POA: Diagnosis not present

## 2016-12-29 DIAGNOSIS — M1711 Unilateral primary osteoarthritis, right knee: Secondary | ICD-10-CM | POA: Diagnosis not present

## 2017-01-01 DIAGNOSIS — M1711 Unilateral primary osteoarthritis, right knee: Secondary | ICD-10-CM | POA: Diagnosis not present

## 2017-01-03 DIAGNOSIS — M1711 Unilateral primary osteoarthritis, right knee: Secondary | ICD-10-CM | POA: Diagnosis not present

## 2017-01-09 DIAGNOSIS — M1711 Unilateral primary osteoarthritis, right knee: Secondary | ICD-10-CM | POA: Diagnosis not present

## 2017-01-15 DIAGNOSIS — M1711 Unilateral primary osteoarthritis, right knee: Secondary | ICD-10-CM | POA: Diagnosis not present

## 2017-01-18 DIAGNOSIS — M1711 Unilateral primary osteoarthritis, right knee: Secondary | ICD-10-CM | POA: Diagnosis not present

## 2017-01-27 DIAGNOSIS — M1711 Unilateral primary osteoarthritis, right knee: Secondary | ICD-10-CM | POA: Diagnosis not present

## 2017-01-27 DIAGNOSIS — M25561 Pain in right knee: Secondary | ICD-10-CM | POA: Diagnosis not present

## 2017-01-31 DIAGNOSIS — Z96651 Presence of right artificial knee joint: Secondary | ICD-10-CM | POA: Diagnosis not present

## 2017-02-05 DIAGNOSIS — Z96651 Presence of right artificial knee joint: Secondary | ICD-10-CM | POA: Diagnosis not present

## 2017-03-06 DIAGNOSIS — J060 Acute laryngopharyngitis: Secondary | ICD-10-CM | POA: Diagnosis not present

## 2017-03-06 DIAGNOSIS — R509 Fever, unspecified: Secondary | ICD-10-CM | POA: Diagnosis not present

## 2017-03-15 ENCOUNTER — Ambulatory Visit
Admission: RE | Admit: 2017-03-15 | Discharge: 2017-03-15 | Disposition: A | Discharge status: Home / Self Care | Payer: BLUE CROSS/BLUE SHIELD | Source: Ambulatory Visit | Attending: Nurse Practitioner | Admitting: Nurse Practitioner

## 2017-03-15 ENCOUNTER — Other Ambulatory Visit: Payer: Self-pay | Admitting: Nurse Practitioner

## 2017-03-15 DIAGNOSIS — J209 Acute bronchitis, unspecified: Secondary | ICD-10-CM | POA: Diagnosis not present

## 2017-03-15 DIAGNOSIS — R509 Fever, unspecified: Secondary | ICD-10-CM | POA: Diagnosis not present

## 2017-03-15 DIAGNOSIS — J208 Acute bronchitis due to other specified organisms: Secondary | ICD-10-CM

## 2017-03-15 DIAGNOSIS — R05 Cough: Secondary | ICD-10-CM | POA: Diagnosis not present

## 2017-03-15 DIAGNOSIS — J060 Acute laryngopharyngitis: Secondary | ICD-10-CM | POA: Diagnosis not present

## 2017-03-23 DIAGNOSIS — J209 Acute bronchitis, unspecified: Secondary | ICD-10-CM | POA: Diagnosis not present

## 2017-04-03 DIAGNOSIS — R05 Cough: Secondary | ICD-10-CM | POA: Diagnosis not present

## 2017-04-03 DIAGNOSIS — J069 Acute upper respiratory infection, unspecified: Secondary | ICD-10-CM | POA: Diagnosis not present

## 2017-05-18 DIAGNOSIS — Z96651 Presence of right artificial knee joint: Secondary | ICD-10-CM | POA: Diagnosis not present

## 2017-07-20 DIAGNOSIS — M7581 Other shoulder lesions, right shoulder: Secondary | ICD-10-CM | POA: Diagnosis not present

## 2017-08-08 DIAGNOSIS — M5412 Radiculopathy, cervical region: Secondary | ICD-10-CM | POA: Diagnosis not present

## 2017-08-23 DIAGNOSIS — M25511 Pain in right shoulder: Secondary | ICD-10-CM | POA: Diagnosis not present

## 2017-08-23 DIAGNOSIS — M542 Cervicalgia: Secondary | ICD-10-CM | POA: Diagnosis not present

## 2017-08-23 DIAGNOSIS — M546 Pain in thoracic spine: Secondary | ICD-10-CM | POA: Diagnosis not present

## 2017-08-23 DIAGNOSIS — M5412 Radiculopathy, cervical region: Secondary | ICD-10-CM | POA: Diagnosis not present

## 2017-08-24 DIAGNOSIS — M25511 Pain in right shoulder: Secondary | ICD-10-CM | POA: Diagnosis not present

## 2017-08-24 DIAGNOSIS — M546 Pain in thoracic spine: Secondary | ICD-10-CM | POA: Diagnosis not present

## 2017-08-24 DIAGNOSIS — M542 Cervicalgia: Secondary | ICD-10-CM | POA: Diagnosis not present

## 2017-08-24 DIAGNOSIS — M5412 Radiculopathy, cervical region: Secondary | ICD-10-CM | POA: Diagnosis not present

## 2017-08-27 DIAGNOSIS — M25511 Pain in right shoulder: Secondary | ICD-10-CM | POA: Diagnosis not present

## 2017-08-27 DIAGNOSIS — M546 Pain in thoracic spine: Secondary | ICD-10-CM | POA: Diagnosis not present

## 2017-08-27 DIAGNOSIS — M5412 Radiculopathy, cervical region: Secondary | ICD-10-CM | POA: Diagnosis not present

## 2017-08-27 DIAGNOSIS — M542 Cervicalgia: Secondary | ICD-10-CM | POA: Diagnosis not present

## 2017-08-29 DIAGNOSIS — M5412 Radiculopathy, cervical region: Secondary | ICD-10-CM | POA: Diagnosis not present

## 2017-08-29 DIAGNOSIS — M25511 Pain in right shoulder: Secondary | ICD-10-CM | POA: Diagnosis not present

## 2017-08-29 DIAGNOSIS — M542 Cervicalgia: Secondary | ICD-10-CM | POA: Diagnosis not present

## 2017-08-29 DIAGNOSIS — M546 Pain in thoracic spine: Secondary | ICD-10-CM | POA: Diagnosis not present

## 2017-08-31 DIAGNOSIS — M25511 Pain in right shoulder: Secondary | ICD-10-CM | POA: Diagnosis not present

## 2017-08-31 DIAGNOSIS — M5412 Radiculopathy, cervical region: Secondary | ICD-10-CM | POA: Diagnosis not present

## 2017-08-31 DIAGNOSIS — M542 Cervicalgia: Secondary | ICD-10-CM | POA: Diagnosis not present

## 2017-08-31 DIAGNOSIS — M546 Pain in thoracic spine: Secondary | ICD-10-CM | POA: Diagnosis not present

## 2017-09-03 DIAGNOSIS — M25511 Pain in right shoulder: Secondary | ICD-10-CM | POA: Diagnosis not present

## 2017-09-03 DIAGNOSIS — M5412 Radiculopathy, cervical region: Secondary | ICD-10-CM | POA: Diagnosis not present

## 2017-09-03 DIAGNOSIS — M542 Cervicalgia: Secondary | ICD-10-CM | POA: Diagnosis not present

## 2017-09-03 DIAGNOSIS — M546 Pain in thoracic spine: Secondary | ICD-10-CM | POA: Diagnosis not present

## 2017-09-05 DIAGNOSIS — M542 Cervicalgia: Secondary | ICD-10-CM | POA: Diagnosis not present

## 2017-09-05 DIAGNOSIS — M5412 Radiculopathy, cervical region: Secondary | ICD-10-CM | POA: Diagnosis not present

## 2017-09-05 DIAGNOSIS — M546 Pain in thoracic spine: Secondary | ICD-10-CM | POA: Diagnosis not present

## 2017-09-05 DIAGNOSIS — M25511 Pain in right shoulder: Secondary | ICD-10-CM | POA: Diagnosis not present

## 2017-09-07 DIAGNOSIS — M5412 Radiculopathy, cervical region: Secondary | ICD-10-CM | POA: Diagnosis not present

## 2017-09-07 DIAGNOSIS — M546 Pain in thoracic spine: Secondary | ICD-10-CM | POA: Diagnosis not present

## 2017-09-07 DIAGNOSIS — M542 Cervicalgia: Secondary | ICD-10-CM | POA: Diagnosis not present

## 2017-09-07 DIAGNOSIS — M25511 Pain in right shoulder: Secondary | ICD-10-CM | POA: Diagnosis not present

## 2017-09-18 ENCOUNTER — Other Ambulatory Visit: Payer: Self-pay | Admitting: Orthopaedic Surgery

## 2017-09-18 DIAGNOSIS — M25511 Pain in right shoulder: Secondary | ICD-10-CM

## 2017-09-18 DIAGNOSIS — M542 Cervicalgia: Secondary | ICD-10-CM

## 2017-09-18 DIAGNOSIS — M5412 Radiculopathy, cervical region: Secondary | ICD-10-CM | POA: Diagnosis not present

## 2017-09-22 ENCOUNTER — Ambulatory Visit
Admission: RE | Admit: 2017-09-22 | Discharge: 2017-09-22 | Disposition: A | Discharge status: Home / Self Care | Payer: BLUE CROSS/BLUE SHIELD | Source: Ambulatory Visit | Attending: Orthopaedic Surgery | Admitting: Orthopaedic Surgery

## 2017-09-22 DIAGNOSIS — M75111 Incomplete rotator cuff tear or rupture of right shoulder, not specified as traumatic: Secondary | ICD-10-CM | POA: Diagnosis not present

## 2017-09-22 DIAGNOSIS — M542 Cervicalgia: Secondary | ICD-10-CM

## 2017-09-22 DIAGNOSIS — M25511 Pain in right shoulder: Secondary | ICD-10-CM

## 2017-09-22 DIAGNOSIS — M4802 Spinal stenosis, cervical region: Secondary | ICD-10-CM | POA: Diagnosis not present

## 2017-09-25 DIAGNOSIS — M25511 Pain in right shoulder: Secondary | ICD-10-CM | POA: Diagnosis not present

## 2017-09-27 DIAGNOSIS — M75111 Incomplete rotator cuff tear or rupture of right shoulder, not specified as traumatic: Secondary | ICD-10-CM | POA: Diagnosis not present

## 2017-09-27 DIAGNOSIS — M6281 Muscle weakness (generalized): Secondary | ICD-10-CM | POA: Diagnosis not present

## 2017-09-27 DIAGNOSIS — S46811D Strain of other muscles, fascia and tendons at shoulder and upper arm level, right arm, subsequent encounter: Secondary | ICD-10-CM | POA: Diagnosis not present

## 2017-10-02 DIAGNOSIS — M75111 Incomplete rotator cuff tear or rupture of right shoulder, not specified as traumatic: Secondary | ICD-10-CM | POA: Diagnosis not present

## 2017-10-02 DIAGNOSIS — M6281 Muscle weakness (generalized): Secondary | ICD-10-CM | POA: Diagnosis not present

## 2017-10-02 DIAGNOSIS — S46811D Strain of other muscles, fascia and tendons at shoulder and upper arm level, right arm, subsequent encounter: Secondary | ICD-10-CM | POA: Diagnosis not present

## 2017-10-05 DIAGNOSIS — S46811D Strain of other muscles, fascia and tendons at shoulder and upper arm level, right arm, subsequent encounter: Secondary | ICD-10-CM | POA: Diagnosis not present

## 2017-10-05 DIAGNOSIS — M75111 Incomplete rotator cuff tear or rupture of right shoulder, not specified as traumatic: Secondary | ICD-10-CM | POA: Diagnosis not present

## 2017-10-05 DIAGNOSIS — M6281 Muscle weakness (generalized): Secondary | ICD-10-CM | POA: Diagnosis not present

## 2017-10-09 DIAGNOSIS — M6281 Muscle weakness (generalized): Secondary | ICD-10-CM | POA: Diagnosis not present

## 2017-10-09 DIAGNOSIS — M75111 Incomplete rotator cuff tear or rupture of right shoulder, not specified as traumatic: Secondary | ICD-10-CM | POA: Diagnosis not present

## 2017-10-09 DIAGNOSIS — S46811D Strain of other muscles, fascia and tendons at shoulder and upper arm level, right arm, subsequent encounter: Secondary | ICD-10-CM | POA: Diagnosis not present

## 2017-10-12 DIAGNOSIS — S46811D Strain of other muscles, fascia and tendons at shoulder and upper arm level, right arm, subsequent encounter: Secondary | ICD-10-CM | POA: Diagnosis not present

## 2017-10-12 DIAGNOSIS — M75111 Incomplete rotator cuff tear or rupture of right shoulder, not specified as traumatic: Secondary | ICD-10-CM | POA: Diagnosis not present

## 2017-10-12 DIAGNOSIS — M6281 Muscle weakness (generalized): Secondary | ICD-10-CM | POA: Diagnosis not present

## 2017-10-16 DIAGNOSIS — M6281 Muscle weakness (generalized): Secondary | ICD-10-CM | POA: Diagnosis not present

## 2017-10-17 DIAGNOSIS — M6281 Muscle weakness (generalized): Secondary | ICD-10-CM | POA: Diagnosis not present

## 2017-10-17 DIAGNOSIS — S46811D Strain of other muscles, fascia and tendons at shoulder and upper arm level, right arm, subsequent encounter: Secondary | ICD-10-CM | POA: Diagnosis not present

## 2017-10-17 DIAGNOSIS — M75111 Incomplete rotator cuff tear or rupture of right shoulder, not specified as traumatic: Secondary | ICD-10-CM | POA: Diagnosis not present

## 2017-10-19 DIAGNOSIS — S46811D Strain of other muscles, fascia and tendons at shoulder and upper arm level, right arm, subsequent encounter: Secondary | ICD-10-CM | POA: Diagnosis not present

## 2017-10-19 DIAGNOSIS — M6281 Muscle weakness (generalized): Secondary | ICD-10-CM | POA: Diagnosis not present

## 2017-10-19 DIAGNOSIS — M75111 Incomplete rotator cuff tear or rupture of right shoulder, not specified as traumatic: Secondary | ICD-10-CM | POA: Diagnosis not present

## 2017-10-22 DIAGNOSIS — M75111 Incomplete rotator cuff tear or rupture of right shoulder, not specified as traumatic: Secondary | ICD-10-CM | POA: Diagnosis not present

## 2017-10-22 DIAGNOSIS — S46811D Strain of other muscles, fascia and tendons at shoulder and upper arm level, right arm, subsequent encounter: Secondary | ICD-10-CM | POA: Diagnosis not present

## 2017-10-22 DIAGNOSIS — M6281 Muscle weakness (generalized): Secondary | ICD-10-CM | POA: Diagnosis not present

## 2017-10-24 DIAGNOSIS — M75111 Incomplete rotator cuff tear or rupture of right shoulder, not specified as traumatic: Secondary | ICD-10-CM | POA: Diagnosis not present

## 2017-10-24 DIAGNOSIS — M6281 Muscle weakness (generalized): Secondary | ICD-10-CM | POA: Diagnosis not present

## 2017-10-24 DIAGNOSIS — S46811D Strain of other muscles, fascia and tendons at shoulder and upper arm level, right arm, subsequent encounter: Secondary | ICD-10-CM | POA: Diagnosis not present

## 2017-10-25 DIAGNOSIS — H6693 Otitis media, unspecified, bilateral: Secondary | ICD-10-CM | POA: Diagnosis not present

## 2017-11-01 DIAGNOSIS — H938X1 Other specified disorders of right ear: Secondary | ICD-10-CM | POA: Diagnosis not present

## 2017-11-06 DIAGNOSIS — H9313 Tinnitus, bilateral: Secondary | ICD-10-CM | POA: Diagnosis not present

## 2017-11-06 DIAGNOSIS — H6593 Unspecified nonsuppurative otitis media, bilateral: Secondary | ICD-10-CM | POA: Diagnosis not present

## 2017-11-07 DIAGNOSIS — S46811D Strain of other muscles, fascia and tendons at shoulder and upper arm level, right arm, subsequent encounter: Secondary | ICD-10-CM | POA: Diagnosis not present

## 2017-11-07 DIAGNOSIS — M6281 Muscle weakness (generalized): Secondary | ICD-10-CM | POA: Diagnosis not present

## 2017-11-07 DIAGNOSIS — M75111 Incomplete rotator cuff tear or rupture of right shoulder, not specified as traumatic: Secondary | ICD-10-CM | POA: Diagnosis not present

## 2017-11-09 DIAGNOSIS — M75111 Incomplete rotator cuff tear or rupture of right shoulder, not specified as traumatic: Secondary | ICD-10-CM | POA: Diagnosis not present

## 2017-11-09 DIAGNOSIS — M6281 Muscle weakness (generalized): Secondary | ICD-10-CM | POA: Diagnosis not present

## 2017-11-09 DIAGNOSIS — S46811D Strain of other muscles, fascia and tendons at shoulder and upper arm level, right arm, subsequent encounter: Secondary | ICD-10-CM | POA: Diagnosis not present

## 2017-11-13 DIAGNOSIS — M75111 Incomplete rotator cuff tear or rupture of right shoulder, not specified as traumatic: Secondary | ICD-10-CM | POA: Diagnosis not present

## 2017-11-13 DIAGNOSIS — S46811D Strain of other muscles, fascia and tendons at shoulder and upper arm level, right arm, subsequent encounter: Secondary | ICD-10-CM | POA: Diagnosis not present

## 2017-11-13 DIAGNOSIS — M6281 Muscle weakness (generalized): Secondary | ICD-10-CM | POA: Diagnosis not present

## 2017-11-16 DIAGNOSIS — M6281 Muscle weakness (generalized): Secondary | ICD-10-CM | POA: Diagnosis not present

## 2017-11-16 DIAGNOSIS — M75111 Incomplete rotator cuff tear or rupture of right shoulder, not specified as traumatic: Secondary | ICD-10-CM | POA: Diagnosis not present

## 2017-11-16 DIAGNOSIS — S46811D Strain of other muscles, fascia and tendons at shoulder and upper arm level, right arm, subsequent encounter: Secondary | ICD-10-CM | POA: Diagnosis not present

## 2017-11-20 DIAGNOSIS — M6281 Muscle weakness (generalized): Secondary | ICD-10-CM | POA: Diagnosis not present

## 2017-11-20 DIAGNOSIS — M75111 Incomplete rotator cuff tear or rupture of right shoulder, not specified as traumatic: Secondary | ICD-10-CM | POA: Diagnosis not present

## 2017-11-20 DIAGNOSIS — S46811D Strain of other muscles, fascia and tendons at shoulder and upper arm level, right arm, subsequent encounter: Secondary | ICD-10-CM | POA: Diagnosis not present

## 2017-11-23 DIAGNOSIS — M25562 Pain in left knee: Secondary | ICD-10-CM | POA: Diagnosis not present

## 2017-11-27 DIAGNOSIS — M75111 Incomplete rotator cuff tear or rupture of right shoulder, not specified as traumatic: Secondary | ICD-10-CM | POA: Diagnosis not present

## 2017-11-27 DIAGNOSIS — S46811D Strain of other muscles, fascia and tendons at shoulder and upper arm level, right arm, subsequent encounter: Secondary | ICD-10-CM | POA: Diagnosis not present

## 2017-11-27 DIAGNOSIS — M6281 Muscle weakness (generalized): Secondary | ICD-10-CM | POA: Diagnosis not present

## 2018-01-12 DIAGNOSIS — M1712 Unilateral primary osteoarthritis, left knee: Secondary | ICD-10-CM | POA: Diagnosis not present

## 2018-01-12 DIAGNOSIS — M25572 Pain in left ankle and joints of left foot: Secondary | ICD-10-CM | POA: Diagnosis not present

## 2018-01-16 DIAGNOSIS — S93492A Sprain of other ligament of left ankle, initial encounter: Secondary | ICD-10-CM | POA: Diagnosis not present

## 2018-02-04 DIAGNOSIS — J3089 Other allergic rhinitis: Secondary | ICD-10-CM | POA: Diagnosis not present

## 2018-02-04 DIAGNOSIS — J301 Allergic rhinitis due to pollen: Secondary | ICD-10-CM | POA: Diagnosis not present

## 2018-02-04 DIAGNOSIS — J3081 Allergic rhinitis due to animal (cat) (dog) hair and dander: Secondary | ICD-10-CM | POA: Diagnosis not present

## 2018-02-04 DIAGNOSIS — R05 Cough: Secondary | ICD-10-CM | POA: Diagnosis not present

## 2018-02-19 IMAGING — CR DG CHEST 2V
2 series · 2 of 2 positions shown · non-contrast
Comparison: 02/29/2016

CLINICAL DATA: Cough and congestion question pneumonia, acute
bronchitis

EXAM:
CHEST  2 VIEW

[w chest pa]
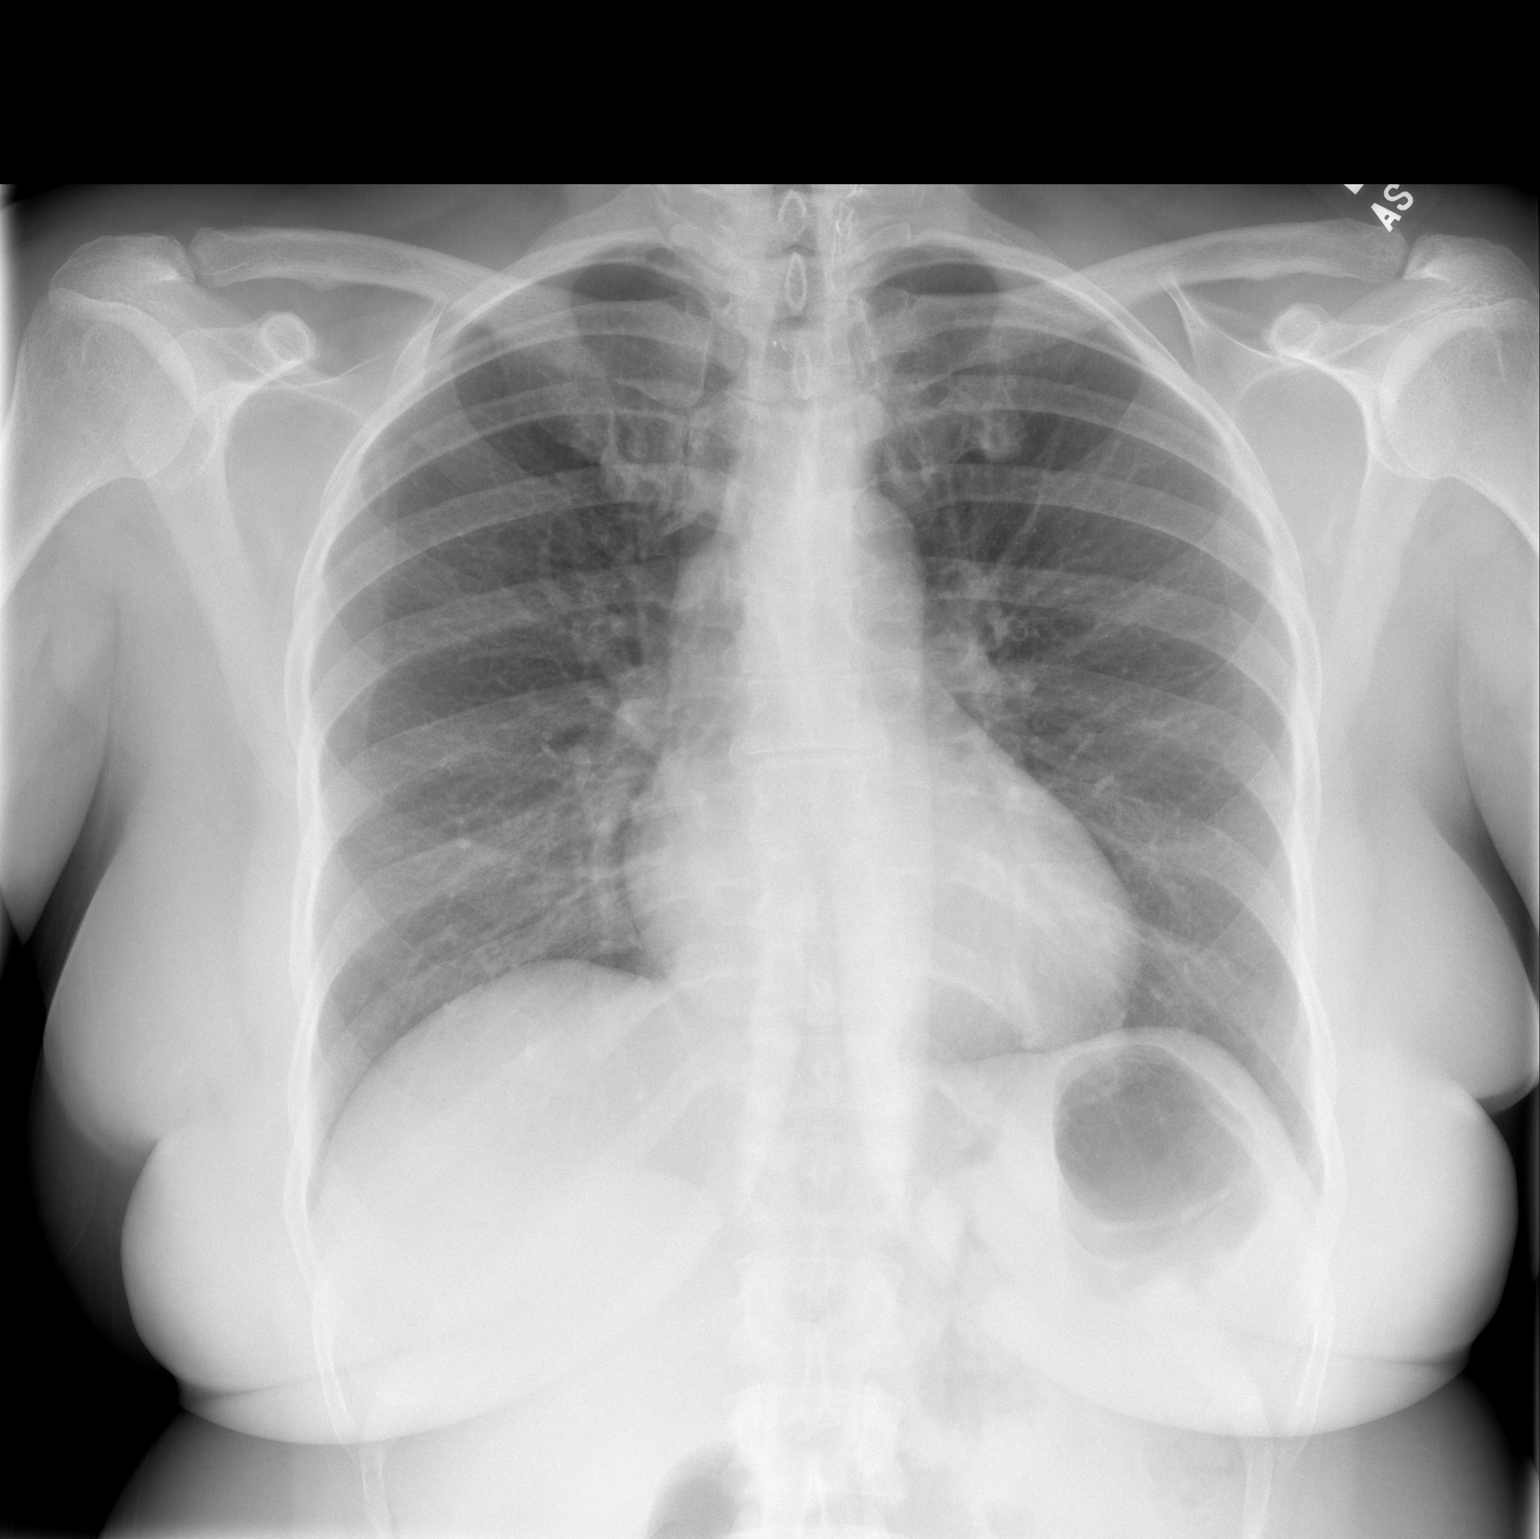

[w chest lat]
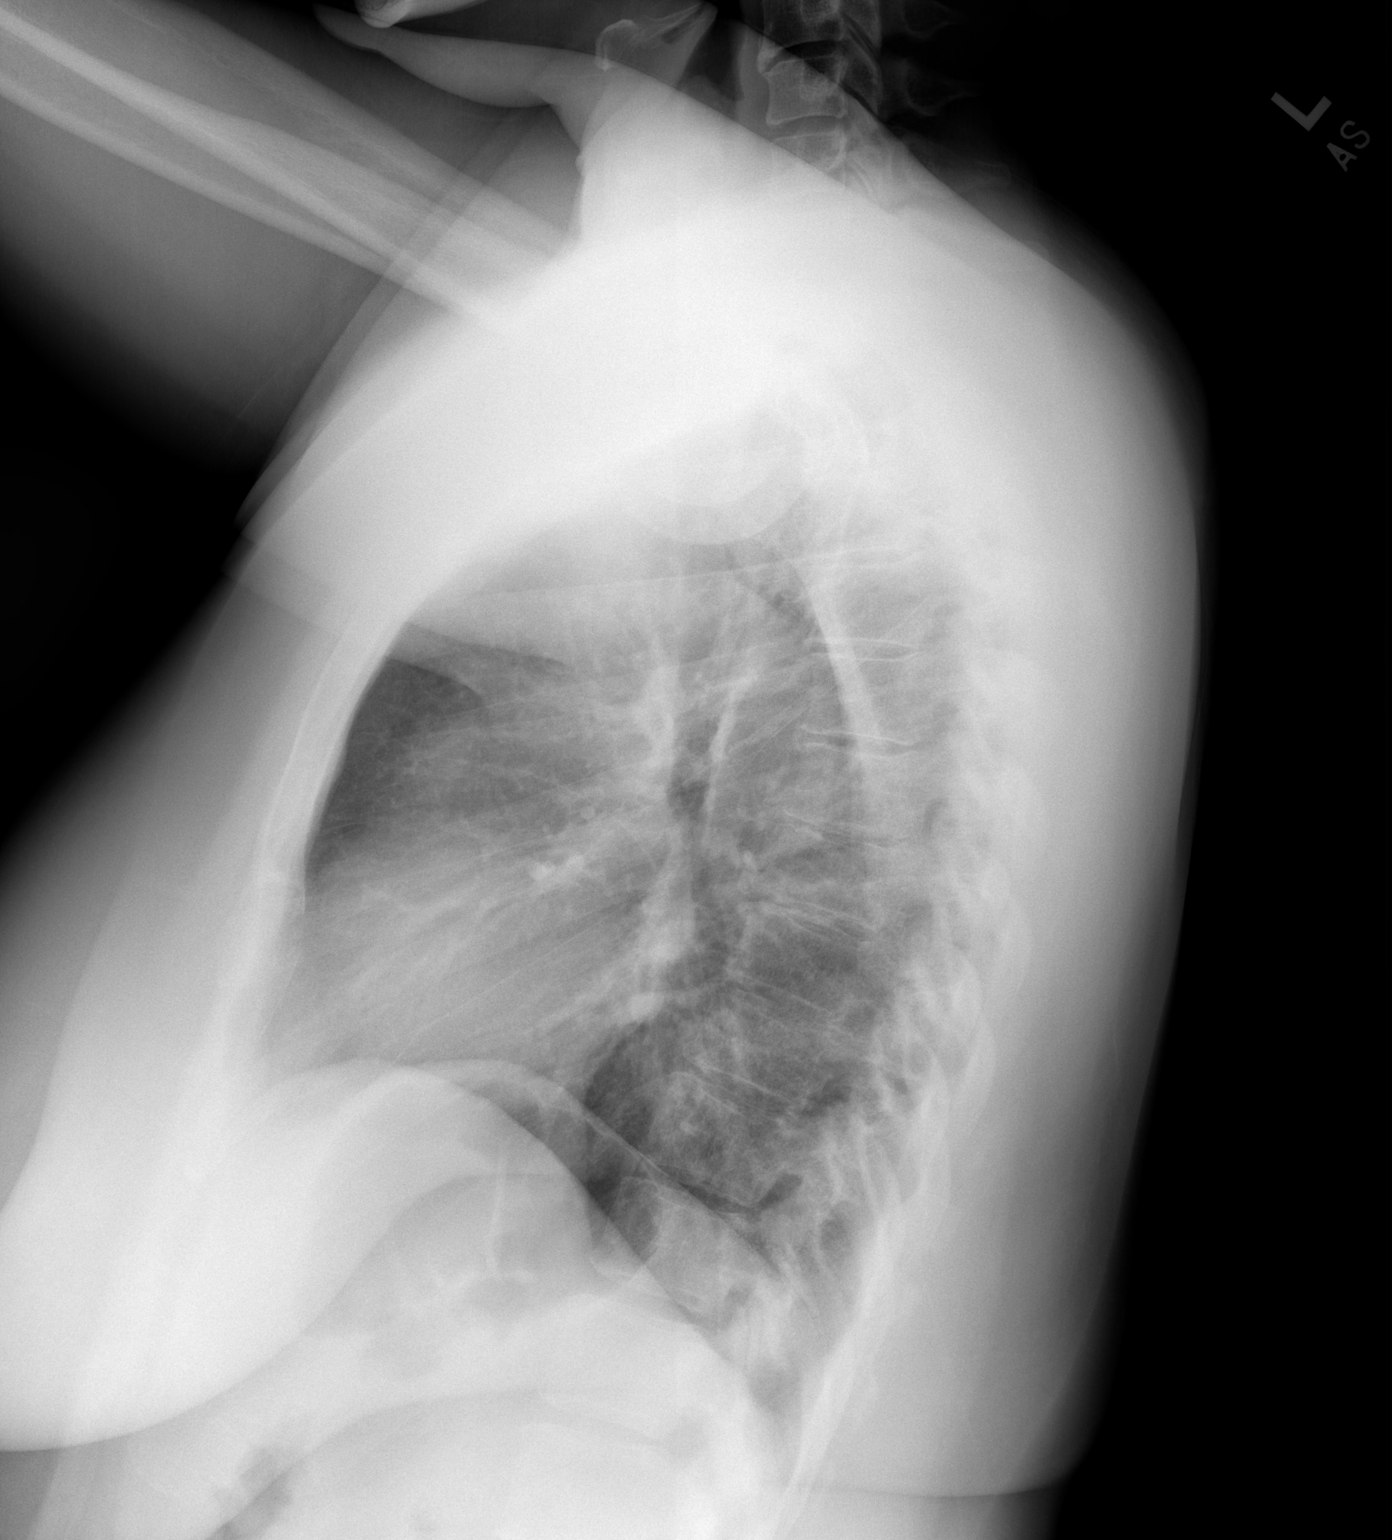

[2 of 2 positions shown; findings below may reference images not displayed]

FINDINGS: Normal heart size, mediastinal contours, and pulmonary vascularity.

Lungs clear.

No pleural effusion or pneumothorax.

Bones unremarkable.
IMPRESSION: Normal exam.

## 2018-02-20 DIAGNOSIS — S93492D Sprain of other ligament of left ankle, subsequent encounter: Secondary | ICD-10-CM | POA: Diagnosis not present

## 2018-02-20 DIAGNOSIS — M1712 Unilateral primary osteoarthritis, left knee: Secondary | ICD-10-CM | POA: Diagnosis not present

## 2018-03-22 ENCOUNTER — Other Ambulatory Visit: Payer: Self-pay | Admitting: Nurse Practitioner

## 2018-03-22 ENCOUNTER — Other Ambulatory Visit (HOSPITAL_COMMUNITY)
Admission: RE | Admit: 2018-03-22 | Discharge: 2018-03-22 | Disposition: A | Discharge status: Home / Self Care | Payer: BLUE CROSS/BLUE SHIELD | Source: Ambulatory Visit | Attending: Nurse Practitioner | Admitting: Nurse Practitioner

## 2018-03-22 DIAGNOSIS — Z0000 Encounter for general adult medical examination without abnormal findings: Secondary | ICD-10-CM | POA: Diagnosis not present

## 2018-03-22 DIAGNOSIS — Z8639 Personal history of other endocrine, nutritional and metabolic disease: Secondary | ICD-10-CM | POA: Diagnosis not present

## 2018-03-22 DIAGNOSIS — K219 Gastro-esophageal reflux disease without esophagitis: Secondary | ICD-10-CM | POA: Diagnosis not present

## 2018-03-22 DIAGNOSIS — Z01419 Encounter for gynecological examination (general) (routine) without abnormal findings: Secondary | ICD-10-CM | POA: Insufficient documentation

## 2018-03-22 DIAGNOSIS — J3089 Other allergic rhinitis: Secondary | ICD-10-CM | POA: Diagnosis not present

## 2018-03-22 DIAGNOSIS — E559 Vitamin D deficiency, unspecified: Secondary | ICD-10-CM | POA: Diagnosis not present

## 2018-03-22 DIAGNOSIS — N959 Unspecified menopausal and perimenopausal disorder: Secondary | ICD-10-CM | POA: Diagnosis not present

## 2018-03-22 DIAGNOSIS — Z13220 Encounter for screening for lipoid disorders: Secondary | ICD-10-CM | POA: Diagnosis not present

## 2018-03-27 DIAGNOSIS — G5762 Lesion of plantar nerve, left lower limb: Secondary | ICD-10-CM | POA: Diagnosis not present

## 2018-03-27 DIAGNOSIS — M65872 Other synovitis and tenosynovitis, left ankle and foot: Secondary | ICD-10-CM | POA: Diagnosis not present

## 2018-03-27 DIAGNOSIS — M25572 Pain in left ankle and joints of left foot: Secondary | ICD-10-CM | POA: Diagnosis not present

## 2018-03-27 LAB — CYTOLOGY - PAP
Diagnosis: NEGATIVE
HPV: NOT DETECTED

## 2018-04-10 DIAGNOSIS — Z1231 Encounter for screening mammogram for malignant neoplasm of breast: Secondary | ICD-10-CM | POA: Diagnosis not present

## 2018-04-11 DIAGNOSIS — J101 Influenza due to other identified influenza virus with other respiratory manifestations: Secondary | ICD-10-CM | POA: Diagnosis not present

## 2018-04-18 DIAGNOSIS — M7752 Other enthesopathy of left foot: Secondary | ICD-10-CM | POA: Diagnosis not present

## 2018-04-18 DIAGNOSIS — G5762 Lesion of plantar nerve, left lower limb: Secondary | ICD-10-CM | POA: Diagnosis not present

## 2018-05-10 DIAGNOSIS — M7752 Other enthesopathy of left foot: Secondary | ICD-10-CM | POA: Diagnosis not present

## 2018-05-10 DIAGNOSIS — G5762 Lesion of plantar nerve, left lower limb: Secondary | ICD-10-CM | POA: Diagnosis not present

## 2018-06-06 DIAGNOSIS — R920 Mammographic microcalcification found on diagnostic imaging of breast: Secondary | ICD-10-CM | POA: Diagnosis not present

## 2018-06-06 DIAGNOSIS — R922 Inconclusive mammogram: Secondary | ICD-10-CM | POA: Diagnosis not present

## 2018-06-06 DIAGNOSIS — R928 Other abnormal and inconclusive findings on diagnostic imaging of breast: Secondary | ICD-10-CM | POA: Diagnosis not present

## 2018-08-12 DIAGNOSIS — M19072 Primary osteoarthritis, left ankle and foot: Secondary | ICD-10-CM | POA: Diagnosis not present

## 2018-08-12 DIAGNOSIS — M12272 Villonodular synovitis (pigmented), left ankle and foot: Secondary | ICD-10-CM | POA: Diagnosis not present

## 2018-08-12 DIAGNOSIS — M25572 Pain in left ankle and joints of left foot: Secondary | ICD-10-CM | POA: Diagnosis not present

## 2018-08-15 DIAGNOSIS — L2084 Intrinsic (allergic) eczema: Secondary | ICD-10-CM | POA: Diagnosis not present

## 2018-08-29 IMAGING — MR MR CERVICAL SPINE W/O CM
4 of 5 series · 24 of 48 positions shown · non-contrast
Comparison: 06/28/2007 thyroid ultrasound.

CLINICAL DATA: 51 y/o F; right arm, shoulder, and neck pain for 2-3
months. Patient unable to raise the right arm.

EXAM:
MRI CERVICAL SPINE WITHOUT CONTRAST
TECHNIQUE: Multiplanar, multisequence MR imaging of the cervical spine was
performed. No intravenous contrast was administered.

[Series 3: T2 post-contrast · sagittal · 3.3mm · 0.37mm/px · 6 of 13 slices shown]
[im 1/13]
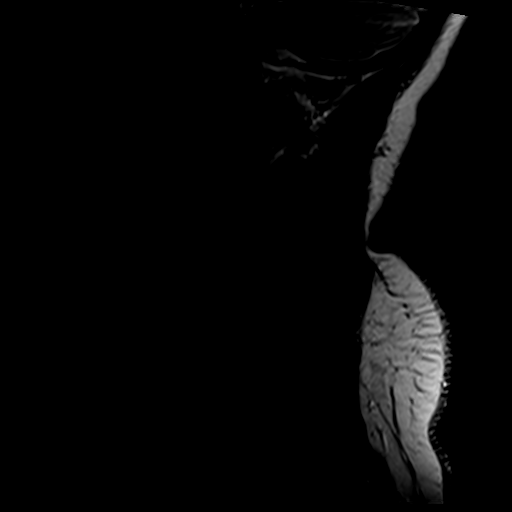
[im 3/13]
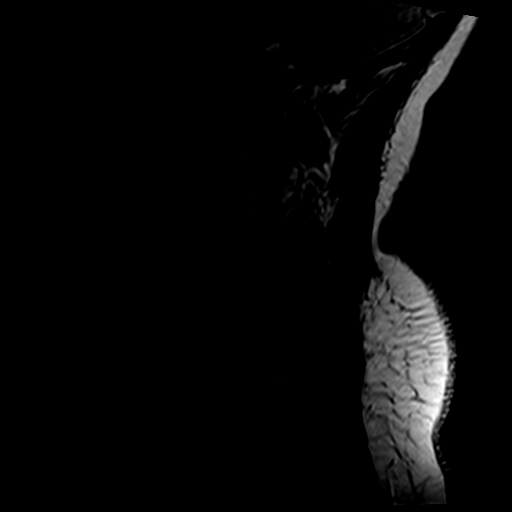
[im 5/13]
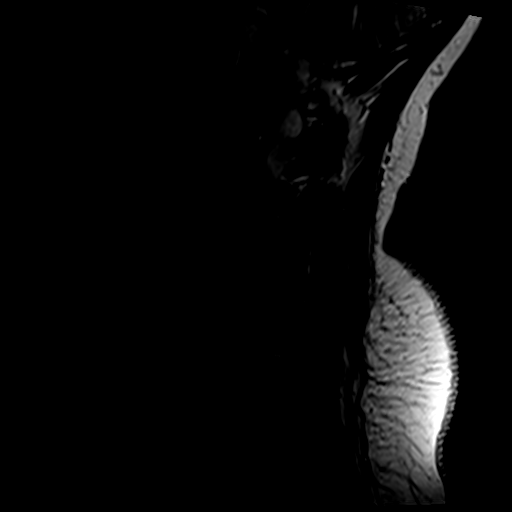
[im 8/13]
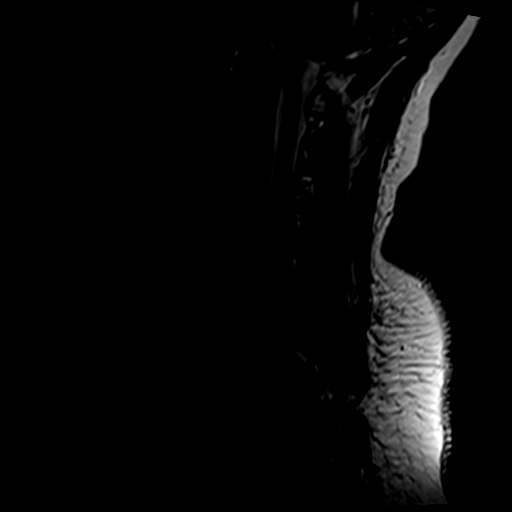
[im 10/13]
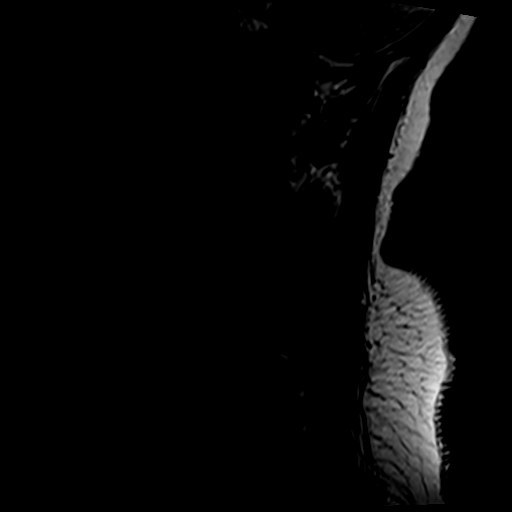
[im 13/13]
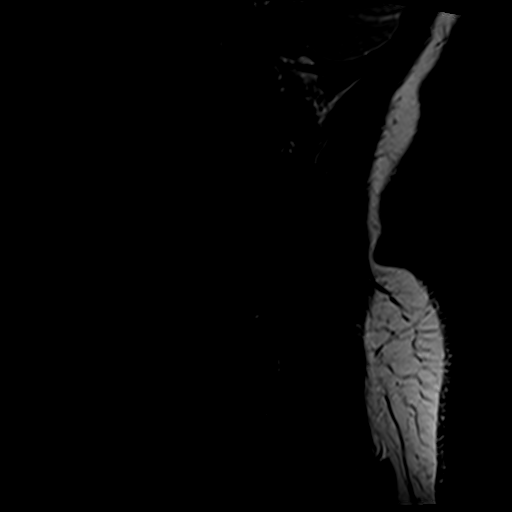

[Series 4: T1 · sagittal · 3.3mm · 0.37mm/px · 7 of 13 slices shown]
[im 1/13]
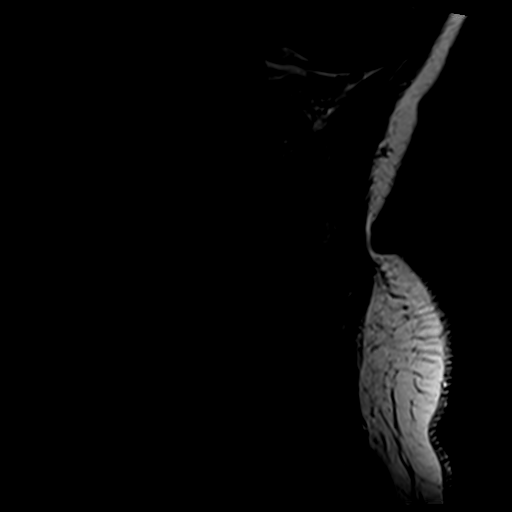
[im 3/13]
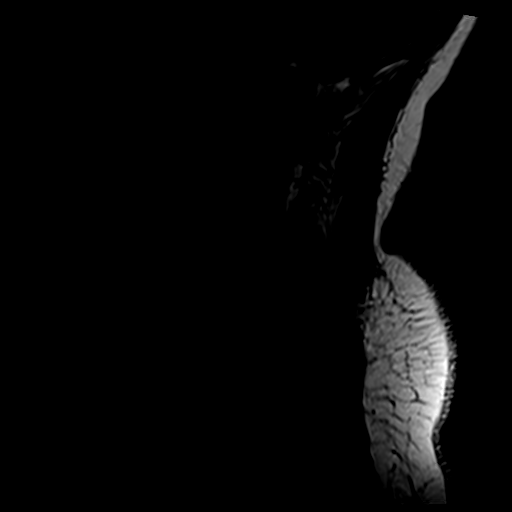
[im 5/13]
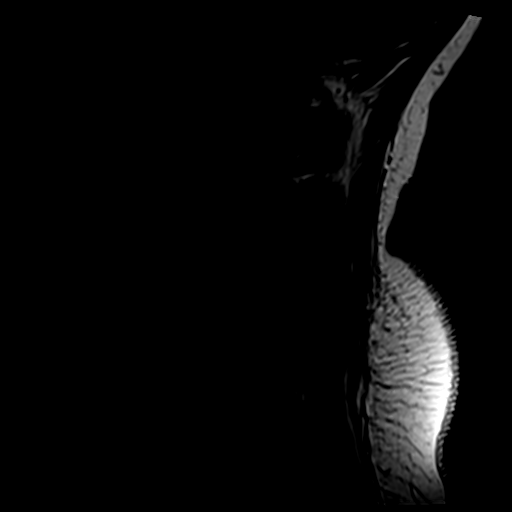
[im 7/13]
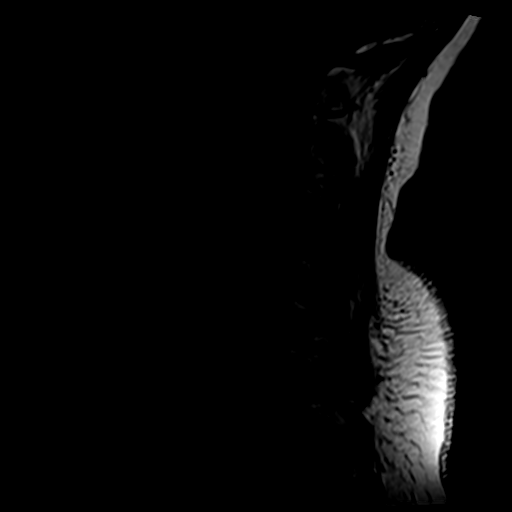
[im 9/13]
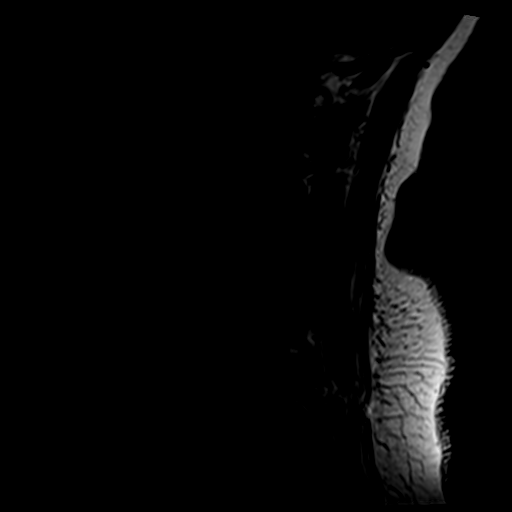
[im 11/13]
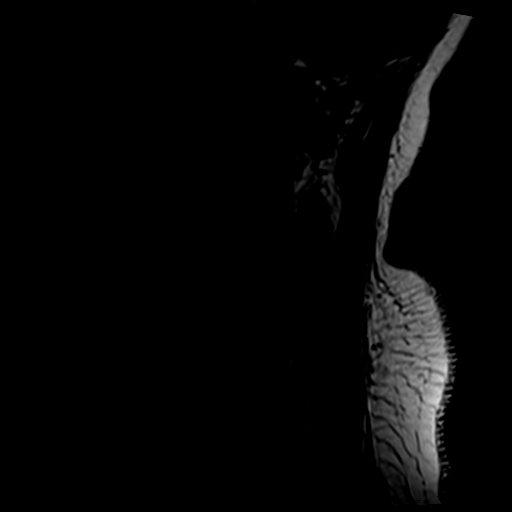
[im 13/13]
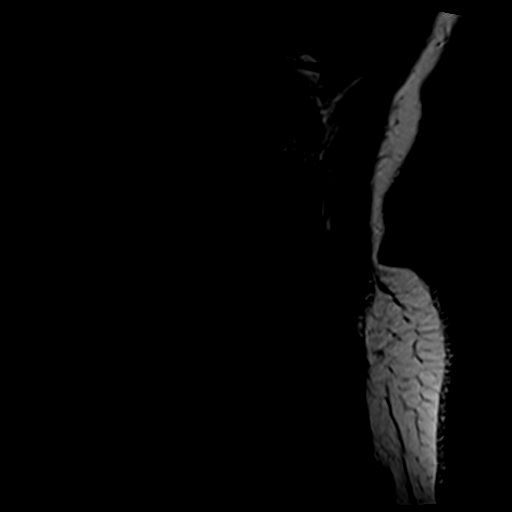

[Series 5: tir sag · sagittal · 3.3mm · 0.37mm/px · 3 of 13 slices shown]
[im 3/13]
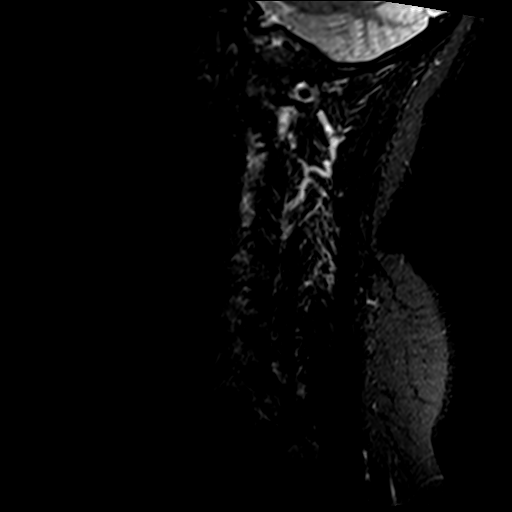
[im 7/13]
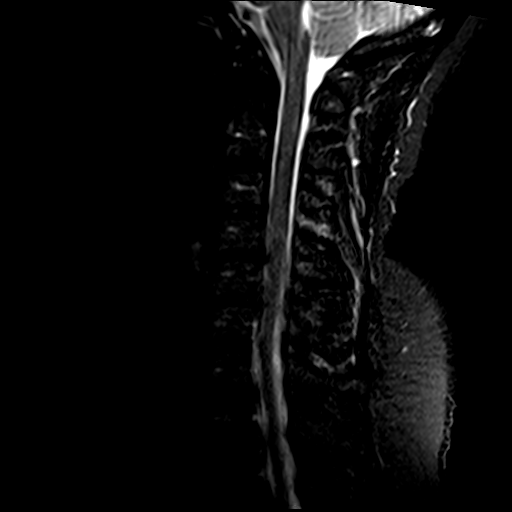
[im 11/13]
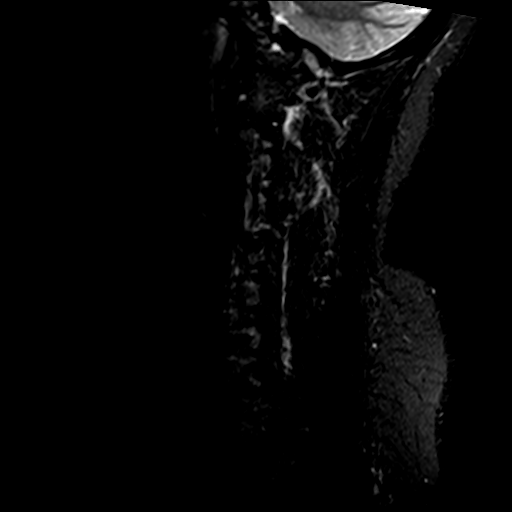

[Series 7: T2 · axial · 3.0mm · 0.70mm/px · z∈[-34,+61]mm · 8 of 26 slices shown]
[im 1/26]
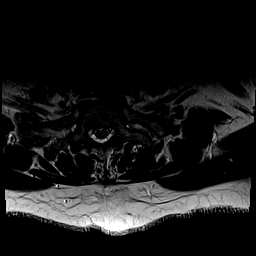
[im 4/26]
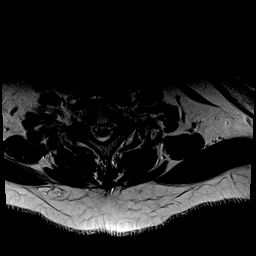
[im 8/26]
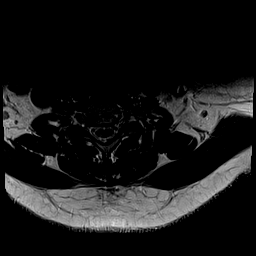
[im 12/26]
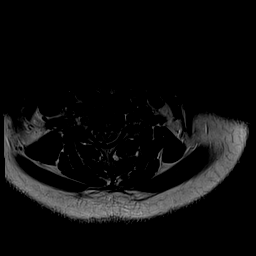
[im 14/26]
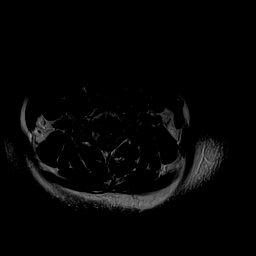
[im 18/26]
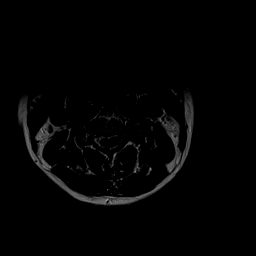
[im 22/26]
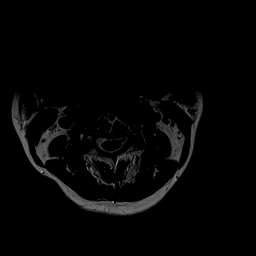
[im 26/26]
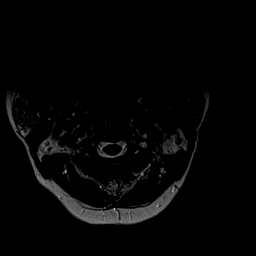

[24 of 48 positions shown; findings below may reference images not displayed]

FINDINGS: Alignment: Physiologic.

Vertebrae: No fracture, evidence of discitis, or bone lesion.

Cord: Normal signal and morphology.

Posterior Fossa, vertebral arteries, paraspinal tissues: Partially
visualize nodule within the right lobe of the thyroid measuring at
least 19 mm.

Disc levels:

C2-3: No significant disc displacement, foraminal stenosis, or canal
stenosis.

C3-4: Mild disc osteophyte complex, tiny central disc protrusion, as
well as bilateral uncovertebral and facet hypertrophy. Mild
bilateral foraminal. No significant canal stenosis.

C4-5: Disc osteophyte complex eccentric to the right with right
central disc protrusion as well as right greater than left
uncovertebral and facet hypertrophy. Moderate right and mild left
foraminal stenosis. Mild canal stenosis. Right anterior cord
impingement with mild cord flattening.

C5-6: Disc osteophyte complex with left central disc protrusion as
well as bilateral uncovertebral and facet hypertrophy. Mild
bilateral foraminal and canal stenosis. Left anterior cord
impingement with mild cord flattening.

C6-7: No significant disc displacement, foraminal stenosis, or canal
stenosis. Mild bilateral facet hypertrophy.

C7-T3: No significant disc displacement. Advanced bilateral facet
hypertrophy at each level with mild bilateral foraminal stenosis. No
significant canal stenosis.
IMPRESSION: 1. No acute osseous or cord signal abnormality.
2. Moderate cervical spondylosis greatest at the C4-5 and C5-6
levels.
3. Multifactorial mild C4-5 and C5-6 canal stenosis with small disc
protrusions and mild cord impingement.
4. Moderate right C4-5 foraminal stenosis and multilevel mild
foraminal stenosis.

By: Shorifulhok Mazeau M.D.

## 2018-08-30 DIAGNOSIS — M25572 Pain in left ankle and joints of left foot: Secondary | ICD-10-CM | POA: Diagnosis not present

## 2018-09-27 DIAGNOSIS — E6609 Other obesity due to excess calories: Secondary | ICD-10-CM | POA: Diagnosis not present

## 2018-09-27 DIAGNOSIS — Z136 Encounter for screening for cardiovascular disorders: Secondary | ICD-10-CM | POA: Diagnosis not present

## 2018-09-27 DIAGNOSIS — Z13220 Encounter for screening for lipoid disorders: Secondary | ICD-10-CM | POA: Diagnosis not present

## 2018-09-27 DIAGNOSIS — E559 Vitamin D deficiency, unspecified: Secondary | ICD-10-CM | POA: Diagnosis not present

## 2018-11-06 DIAGNOSIS — R635 Abnormal weight gain: Secondary | ICD-10-CM | POA: Diagnosis not present

## 2018-11-06 DIAGNOSIS — Z13220 Encounter for screening for lipoid disorders: Secondary | ICD-10-CM | POA: Diagnosis not present

## 2018-11-06 DIAGNOSIS — E6609 Other obesity due to excess calories: Secondary | ICD-10-CM | POA: Diagnosis not present

## 2018-11-06 DIAGNOSIS — Z136 Encounter for screening for cardiovascular disorders: Secondary | ICD-10-CM | POA: Diagnosis not present

## 2018-11-06 DIAGNOSIS — E559 Vitamin D deficiency, unspecified: Secondary | ICD-10-CM | POA: Diagnosis not present

## 2018-11-06 DIAGNOSIS — Z131 Encounter for screening for diabetes mellitus: Secondary | ICD-10-CM | POA: Diagnosis not present

## 2018-12-31 DIAGNOSIS — E6609 Other obesity due to excess calories: Secondary | ICD-10-CM | POA: Diagnosis not present

## 2018-12-31 DIAGNOSIS — Z713 Dietary counseling and surveillance: Secondary | ICD-10-CM | POA: Diagnosis not present

## 2018-12-31 DIAGNOSIS — Z6832 Body mass index (BMI) 32.0-32.9, adult: Secondary | ICD-10-CM | POA: Diagnosis not present

## 2019-01-07 DIAGNOSIS — E6609 Other obesity due to excess calories: Secondary | ICD-10-CM | POA: Diagnosis not present

## 2019-01-13 DIAGNOSIS — Z1211 Encounter for screening for malignant neoplasm of colon: Secondary | ICD-10-CM | POA: Diagnosis not present

## 2019-01-13 DIAGNOSIS — Z23 Encounter for immunization: Secondary | ICD-10-CM | POA: Diagnosis not present

## 2019-01-13 DIAGNOSIS — R928 Other abnormal and inconclusive findings on diagnostic imaging of breast: Secondary | ICD-10-CM | POA: Diagnosis not present

## 2019-02-03 DIAGNOSIS — L669 Cicatricial alopecia, unspecified: Secondary | ICD-10-CM | POA: Diagnosis not present

## 2019-02-05 DIAGNOSIS — R05 Cough: Secondary | ICD-10-CM | POA: Diagnosis not present

## 2019-02-05 DIAGNOSIS — J3081 Allergic rhinitis due to animal (cat) (dog) hair and dander: Secondary | ICD-10-CM | POA: Diagnosis not present

## 2019-02-05 DIAGNOSIS — J3089 Other allergic rhinitis: Secondary | ICD-10-CM | POA: Diagnosis not present

## 2019-02-05 DIAGNOSIS — J301 Allergic rhinitis due to pollen: Secondary | ICD-10-CM | POA: Diagnosis not present

## 2019-03-21 DIAGNOSIS — R922 Inconclusive mammogram: Secondary | ICD-10-CM | POA: Diagnosis not present

## 2019-03-21 DIAGNOSIS — R921 Mammographic calcification found on diagnostic imaging of breast: Secondary | ICD-10-CM | POA: Diagnosis not present

## 2019-06-06 DIAGNOSIS — Z13220 Encounter for screening for lipoid disorders: Secondary | ICD-10-CM | POA: Diagnosis not present

## 2019-06-06 DIAGNOSIS — E559 Vitamin D deficiency, unspecified: Secondary | ICD-10-CM | POA: Diagnosis not present

## 2019-06-06 DIAGNOSIS — Z23 Encounter for immunization: Secondary | ICD-10-CM | POA: Diagnosis not present

## 2019-06-06 DIAGNOSIS — Z0000 Encounter for general adult medical examination without abnormal findings: Secondary | ICD-10-CM | POA: Diagnosis not present

## 2019-06-06 DIAGNOSIS — R946 Abnormal results of thyroid function studies: Secondary | ICD-10-CM | POA: Diagnosis not present

## 2019-06-20 DIAGNOSIS — Z1211 Encounter for screening for malignant neoplasm of colon: Secondary | ICD-10-CM | POA: Diagnosis not present

## 2019-06-20 DIAGNOSIS — M170 Bilateral primary osteoarthritis of knee: Secondary | ICD-10-CM | POA: Diagnosis not present

## 2019-06-20 DIAGNOSIS — S40862A Insect bite (nonvenomous) of left upper arm, initial encounter: Secondary | ICD-10-CM | POA: Diagnosis not present

## 2019-06-27 DIAGNOSIS — L669 Cicatricial alopecia, unspecified: Secondary | ICD-10-CM | POA: Diagnosis not present

## 2019-06-27 DIAGNOSIS — L989 Disorder of the skin and subcutaneous tissue, unspecified: Secondary | ICD-10-CM | POA: Diagnosis not present

## 2019-06-27 DIAGNOSIS — L658 Other specified nonscarring hair loss: Secondary | ICD-10-CM | POA: Diagnosis not present

## 2019-06-27 DIAGNOSIS — W57XXXA Bitten or stung by nonvenomous insect and other nonvenomous arthropods, initial encounter: Secondary | ICD-10-CM | POA: Diagnosis not present

## 2019-06-27 DIAGNOSIS — L089 Local infection of the skin and subcutaneous tissue, unspecified: Secondary | ICD-10-CM | POA: Diagnosis not present

## 2021-10-18 ENCOUNTER — Ambulatory Visit
Admission: RE | Admit: 2021-10-18 | Discharge: 2021-10-18 | Disposition: A | Discharge status: Home / Self Care | Payer: Managed Care, Other (non HMO) | Source: Ambulatory Visit | Attending: Family Medicine | Admitting: Family Medicine

## 2021-10-18 ENCOUNTER — Other Ambulatory Visit: Payer: Self-pay | Admitting: Family Medicine

## 2021-10-18 DIAGNOSIS — R058 Other specified cough: Secondary | ICD-10-CM

## 2021-10-25 ENCOUNTER — Other Ambulatory Visit: Payer: Self-pay | Admitting: Family Medicine

## 2021-10-25 DIAGNOSIS — R058 Other specified cough: Secondary | ICD-10-CM

## 2021-10-25 DIAGNOSIS — R9389 Abnormal findings on diagnostic imaging of other specified body structures: Secondary | ICD-10-CM

## 2021-11-11 ENCOUNTER — Other Ambulatory Visit (HOSPITAL_COMMUNITY): Payer: Self-pay

## 2021-11-11 MED ORDER — SAXENDA 18 MG/3ML ~~LOC~~ SOPN
PEN_INJECTOR | SUBCUTANEOUS | 3 refills | Status: DC
  Filled 2021-11-11: qty 15, 44d supply, fill #0

## 2021-11-11 MED ORDER — UNIFINE PENTIPS 32G X 6 MM MISC
3 refills | Status: DC
  Filled 2021-11-11: qty 100, 90d supply, fill #0

## 2021-11-21 ENCOUNTER — Other Ambulatory Visit: Payer: BLUE CROSS/BLUE SHIELD

## 2021-12-02 ENCOUNTER — Other Ambulatory Visit (HOSPITAL_COMMUNITY): Payer: Self-pay

## 2021-12-02 MED ORDER — WEGOVY 1.7 MG/0.75ML ~~LOC~~ SOAJ
1.7000 mg | SUBCUTANEOUS | 4 refills | Status: DC
  Filled 2021-12-02: qty 3, 28d supply, fill #0
  Filled 2022-01-13: qty 3, 28d supply, fill #1
  Filled 2022-03-03 (×2): qty 3, 28d supply, fill #2

## 2021-12-09 ENCOUNTER — Other Ambulatory Visit (HOSPITAL_COMMUNITY): Payer: Self-pay

## 2021-12-12 ENCOUNTER — Other Ambulatory Visit (HOSPITAL_COMMUNITY): Payer: Self-pay

## 2022-01-03 ENCOUNTER — Other Ambulatory Visit (HOSPITAL_COMMUNITY): Payer: Self-pay

## 2022-01-13 ENCOUNTER — Other Ambulatory Visit (HOSPITAL_COMMUNITY): Payer: Self-pay

## 2022-01-16 ENCOUNTER — Other Ambulatory Visit (HOSPITAL_COMMUNITY): Payer: Self-pay

## 2022-01-17 ENCOUNTER — Other Ambulatory Visit (HOSPITAL_COMMUNITY): Payer: Self-pay

## 2022-03-03 ENCOUNTER — Other Ambulatory Visit (HOSPITAL_COMMUNITY): Payer: Self-pay

## 2022-04-07 ENCOUNTER — Other Ambulatory Visit (HOSPITAL_COMMUNITY): Payer: Self-pay

## 2022-04-07 ENCOUNTER — Other Ambulatory Visit: Payer: Self-pay

## 2022-04-07 MED ORDER — TOPIRAMATE 25 MG PO TABS
25.0000 mg | ORAL_TABLET | Freq: Every day | ORAL | 4 refills | Status: DC
  Filled 2022-04-07: qty 70, 70d supply, fill #0
  Filled 2022-10-27: qty 70, 70d supply, fill #1

## 2022-04-07 MED ORDER — WEGOVY 2.4 MG/0.75ML ~~LOC~~ SOAJ
2.4000 mg | SUBCUTANEOUS | 4 refills | Status: DC
  Filled 2022-04-07: qty 3, 28d supply, fill #0

## 2022-04-08 ENCOUNTER — Other Ambulatory Visit (HOSPITAL_COMMUNITY): Payer: Self-pay

## 2022-04-10 ENCOUNTER — Other Ambulatory Visit: Payer: Self-pay

## 2022-05-26 ENCOUNTER — Other Ambulatory Visit (HOSPITAL_COMMUNITY): Payer: Self-pay

## 2022-05-26 MED ORDER — WEGOVY 1.7 MG/0.75ML ~~LOC~~ SOAJ
1.7000 mg | SUBCUTANEOUS | 3 refills | Status: DC
  Filled 2022-05-26: qty 3, 28d supply, fill #0

## 2022-05-29 ENCOUNTER — Other Ambulatory Visit: Payer: Self-pay

## 2022-06-28 ENCOUNTER — Other Ambulatory Visit (HOSPITAL_COMMUNITY): Payer: Self-pay

## 2022-06-28 ENCOUNTER — Other Ambulatory Visit: Payer: Self-pay

## 2022-06-28 MED ORDER — WEGOVY 1.7 MG/0.75ML ~~LOC~~ SOAJ
1.7000 mg | SUBCUTANEOUS | 3 refills | Status: DC
  Filled 2022-06-28: qty 3, 28d supply, fill #0
  Filled 2022-08-01: qty 3, 28d supply, fill #1
  Filled 2022-08-29: qty 3, 28d supply, fill #2
  Filled 2022-09-28: qty 3, 28d supply, fill #3

## 2022-08-01 ENCOUNTER — Other Ambulatory Visit (HOSPITAL_COMMUNITY): Payer: Self-pay

## 2022-08-01 ENCOUNTER — Other Ambulatory Visit: Payer: Self-pay

## 2022-08-29 ENCOUNTER — Other Ambulatory Visit (HOSPITAL_COMMUNITY): Payer: Self-pay

## 2022-08-30 ENCOUNTER — Other Ambulatory Visit (HOSPITAL_COMMUNITY): Payer: Self-pay

## 2022-09-28 ENCOUNTER — Other Ambulatory Visit (HOSPITAL_COMMUNITY): Payer: Self-pay

## 2022-10-20 ENCOUNTER — Other Ambulatory Visit (HOSPITAL_COMMUNITY): Payer: Self-pay

## 2022-10-20 MED ORDER — WEGOVY 1.7 MG/0.75ML ~~LOC~~ SOAJ
1.7000 mg | SUBCUTANEOUS | 3 refills | Status: DC
  Filled 2022-10-20: qty 3, 28d supply, fill #0
  Filled 2022-12-08: qty 3, 28d supply, fill #1
  Filled 2023-01-16: qty 3, 28d supply, fill #2
  Filled 2023-03-14: qty 3, 28d supply, fill #3

## 2022-10-20 MED ORDER — TOPIRAMATE 25 MG PO TABS
25.0000 mg | ORAL_TABLET | Freq: Every day | ORAL | 4 refills | Status: DC
  Filled 2022-10-20: qty 70, 70d supply, fill #0

## 2022-10-21 ENCOUNTER — Other Ambulatory Visit (HOSPITAL_COMMUNITY): Payer: Self-pay

## 2022-10-23 ENCOUNTER — Other Ambulatory Visit (HOSPITAL_COMMUNITY): Payer: Self-pay

## 2022-10-27 ENCOUNTER — Other Ambulatory Visit (HOSPITAL_COMMUNITY): Payer: Self-pay

## 2022-11-04 ENCOUNTER — Other Ambulatory Visit (HOSPITAL_COMMUNITY): Payer: Self-pay

## 2022-12-08 ENCOUNTER — Other Ambulatory Visit (HOSPITAL_COMMUNITY): Payer: Self-pay

## 2022-12-11 ENCOUNTER — Other Ambulatory Visit (HOSPITAL_COMMUNITY): Payer: Self-pay

## 2023-01-17 ENCOUNTER — Other Ambulatory Visit: Payer: Self-pay

## 2023-01-17 ENCOUNTER — Other Ambulatory Visit (HOSPITAL_COMMUNITY): Payer: Self-pay

## 2023-02-02 ENCOUNTER — Other Ambulatory Visit (HOSPITAL_COMMUNITY): Payer: Self-pay

## 2023-02-02 MED ORDER — WEGOVY 1.7 MG/0.75ML ~~LOC~~ SOAJ
1.7000 mg | SUBCUTANEOUS | 3 refills | Status: DC
  Filled 2023-02-02: qty 3, 28d supply, fill #0

## 2023-02-05 ENCOUNTER — Other Ambulatory Visit (HOSPITAL_COMMUNITY): Payer: Self-pay

## 2023-03-15 ENCOUNTER — Other Ambulatory Visit: Payer: Self-pay

## 2023-03-15 ENCOUNTER — Other Ambulatory Visit (HOSPITAL_COMMUNITY): Payer: Self-pay

## 2023-04-28 ENCOUNTER — Other Ambulatory Visit (HOSPITAL_COMMUNITY): Payer: Self-pay

## 2023-04-28 ENCOUNTER — Other Ambulatory Visit (HOSPITAL_BASED_OUTPATIENT_CLINIC_OR_DEPARTMENT_OTHER): Payer: Self-pay

## 2023-04-30 ENCOUNTER — Other Ambulatory Visit (HOSPITAL_COMMUNITY): Payer: Self-pay

## 2023-05-01 ENCOUNTER — Other Ambulatory Visit (HOSPITAL_COMMUNITY): Payer: Self-pay

## 2023-05-01 ENCOUNTER — Other Ambulatory Visit (HOSPITAL_BASED_OUTPATIENT_CLINIC_OR_DEPARTMENT_OTHER): Payer: Self-pay

## 2023-05-01 ENCOUNTER — Other Ambulatory Visit: Payer: Self-pay

## 2023-05-01 MED ORDER — ZEPBOUND 5 MG/0.5ML ~~LOC~~ SOAJ
5.0000 mg | SUBCUTANEOUS | 1 refills | Status: DC
  Filled 2023-05-01: qty 2, 28d supply, fill #0
  Filled 2023-05-31: qty 2, 28d supply, fill #1

## 2023-05-01 MED ORDER — TOPIRAMATE 25 MG PO TABS
25.0000 mg | ORAL_TABLET | Freq: Every day | ORAL | 4 refills | Status: DC
  Filled 2023-05-01: qty 70, 70d supply, fill #0

## 2023-05-02 ENCOUNTER — Other Ambulatory Visit: Payer: Self-pay

## 2023-05-08 ENCOUNTER — Other Ambulatory Visit (HOSPITAL_COMMUNITY): Payer: Self-pay

## 2023-05-31 ENCOUNTER — Other Ambulatory Visit (HOSPITAL_COMMUNITY): Payer: Self-pay

## 2023-06-22 ENCOUNTER — Other Ambulatory Visit (HOSPITAL_COMMUNITY): Payer: Self-pay

## 2023-06-22 ENCOUNTER — Other Ambulatory Visit: Payer: Self-pay

## 2023-06-22 MED ORDER — ZEPBOUND 7.5 MG/0.5ML ~~LOC~~ SOAJ
7.5000 mg | SUBCUTANEOUS | 1 refills | Status: DC
  Filled 2023-06-22: qty 2, 28d supply, fill #0
  Filled 2023-07-21 – 2023-07-23 (×2): qty 2, 28d supply, fill #1

## 2023-07-21 ENCOUNTER — Other Ambulatory Visit (HOSPITAL_COMMUNITY): Payer: Self-pay

## 2023-07-23 ENCOUNTER — Other Ambulatory Visit: Payer: Self-pay

## 2023-07-23 ENCOUNTER — Other Ambulatory Visit (HOSPITAL_COMMUNITY): Payer: Self-pay

## 2023-08-10 ENCOUNTER — Other Ambulatory Visit: Payer: Self-pay

## 2023-08-10 ENCOUNTER — Other Ambulatory Visit (HOSPITAL_COMMUNITY): Payer: Self-pay

## 2023-08-10 MED ORDER — ZEPBOUND 7.5 MG/0.5ML ~~LOC~~ SOAJ
7.5000 mg | SUBCUTANEOUS | 1 refills | Status: DC
  Filled 2023-08-10 – 2023-08-14 (×2): qty 2, 28d supply, fill #0
  Filled 2023-09-17: qty 2, 28d supply, fill #1

## 2023-08-10 MED ORDER — TOPIRAMATE 25 MG PO TABS
25.0000 mg | ORAL_TABLET | Freq: Every day | ORAL | 4 refills | Status: DC
  Filled 2023-08-10: qty 70, 70d supply, fill #0

## 2023-08-14 ENCOUNTER — Other Ambulatory Visit: Payer: Self-pay

## 2023-08-14 ENCOUNTER — Other Ambulatory Visit (HOSPITAL_BASED_OUTPATIENT_CLINIC_OR_DEPARTMENT_OTHER): Payer: Self-pay

## 2023-08-14 ENCOUNTER — Other Ambulatory Visit (HOSPITAL_COMMUNITY): Payer: Self-pay

## 2023-09-17 ENCOUNTER — Other Ambulatory Visit (HOSPITAL_BASED_OUTPATIENT_CLINIC_OR_DEPARTMENT_OTHER): Payer: Self-pay

## 2023-09-17 ENCOUNTER — Other Ambulatory Visit (HOSPITAL_COMMUNITY): Payer: Self-pay

## 2023-09-18 ENCOUNTER — Other Ambulatory Visit (HOSPITAL_COMMUNITY): Payer: Self-pay

## 2023-10-05 ENCOUNTER — Other Ambulatory Visit (HOSPITAL_COMMUNITY): Payer: Self-pay

## 2023-10-05 MED ORDER — TOPIRAMATE 25 MG PO TABS
25.0000 mg | ORAL_TABLET | Freq: Every day | ORAL | 4 refills | Status: DC
  Filled 2023-10-05: qty 70, 70d supply, fill #0

## 2023-10-05 MED ORDER — ZEPBOUND 5 MG/0.5ML ~~LOC~~ SOAJ
5.0000 mg | SUBCUTANEOUS | 1 refills | Status: DC
  Filled 2023-10-05: qty 2, 28d supply, fill #0
  Filled 2023-11-04 – 2023-11-05 (×2): qty 2, 28d supply, fill #1

## 2023-10-09 ENCOUNTER — Other Ambulatory Visit (HOSPITAL_COMMUNITY): Payer: Self-pay

## 2023-11-04 ENCOUNTER — Other Ambulatory Visit (HOSPITAL_COMMUNITY): Payer: Self-pay

## 2023-11-05 ENCOUNTER — Other Ambulatory Visit (HOSPITAL_COMMUNITY): Payer: Self-pay

## 2023-11-05 ENCOUNTER — Other Ambulatory Visit: Payer: Self-pay

## 2023-12-16 ENCOUNTER — Other Ambulatory Visit (HOSPITAL_COMMUNITY): Payer: Self-pay

## 2023-12-19 ENCOUNTER — Other Ambulatory Visit (HOSPITAL_BASED_OUTPATIENT_CLINIC_OR_DEPARTMENT_OTHER): Payer: Self-pay

## 2023-12-19 ENCOUNTER — Other Ambulatory Visit (HOSPITAL_COMMUNITY): Payer: Self-pay

## 2023-12-19 ENCOUNTER — Other Ambulatory Visit: Payer: Self-pay

## 2023-12-19 MED ORDER — ZEPBOUND 5 MG/0.5ML ~~LOC~~ SOAJ
5.0000 mg | SUBCUTANEOUS | 1 refills | Status: DC
  Filled 2023-12-19: qty 2, 28d supply, fill #0

## 2024-01-04 ENCOUNTER — Other Ambulatory Visit (HOSPITAL_COMMUNITY): Payer: Self-pay

## 2024-01-04 ENCOUNTER — Other Ambulatory Visit: Payer: Self-pay

## 2024-01-04 MED ORDER — ZEPBOUND 7.5 MG/0.5ML ~~LOC~~ SOAJ
7.5000 mg | SUBCUTANEOUS | 0 refills | Status: AC
  Filled 2024-01-04 – 2024-02-12 (×2): qty 6, 84d supply, fill #0

## 2024-01-04 MED ORDER — TOPIRAMATE 25 MG PO TABS
25.0000 mg | ORAL_TABLET | Freq: Every day | ORAL | 4 refills | Status: DC
  Filled 2024-01-04: qty 70, 70d supply, fill #0

## 2024-01-13 ENCOUNTER — Ambulatory Visit: Admission: EM | Admit: 2024-01-13 | Discharge: 2024-01-13 | Disposition: A | Discharge status: Home / Self Care

## 2024-01-13 DIAGNOSIS — B029 Zoster without complications: Secondary | ICD-10-CM

## 2024-01-13 MED ORDER — TRIAMCINOLONE ACETONIDE 0.1 % EX CREA: 1.0000 | TOPICAL_CREAM | Freq: Two times a day (BID) | CUTANEOUS | 0 refills | Status: AC

## 2024-01-13 MED ORDER — VALACYCLOVIR HCL 1 G PO TABS: 1000.0000 mg | ORAL_TABLET | Freq: Three times a day (TID) | ORAL | 0 refills | Status: AC

## 2024-01-13 NOTE — ED Provider Notes (Signed)
 [facility]  Note:  This document was prepared using Dragon voice recognition software and may include unintentional dictation errors.  MRN: [id number] DOB: 05-31-1965 DATE: 01/13/24   Subjective:  Chief Complaint:  Chief Complaint  Patient presents with   Rash     HPI: Kendra [name] is a 58 y.o. female presenting for rash to the left side of her neck for the past 3 days. Patient reports pruritus, but no pain. She states no rash elsewhere on her body. She does report changing washes. She also reports history of chicken pox, but has had the shingles vaccine.  She does work in teacher, music. She has been applying over-the-counter hydrocortisone cream with no relief.  Denies fever, nausea/vomiting, abdominal pain, myalgias. Endorses rash, pruritus. Presents NAD.  Prior to Admission medications   Medication Sig Start Date End Date Taking? Authorizing Provider  fexofenadine (ALLEGRA ODT) 30 MG disintegrating tablet Take 30 mg by mouth daily.   Yes [provider]  tirzepatide  (ZEPBOUND ) 7.5 MG/0.5ML Pen Inject 7.5 mg into the skin once a week. 01/04/24        Allergies  Allergen Reactions   Benzonatate Itching    Throat itches  Throat itches    Throat itches  benzonatate  Throat itches    benzonatate   Green Tea Leaf Ext Nausea And Vomiting and Nausea Only    projectile vomiting any tea not just green tea   Sulfamethoxazole-Trimethoprim Dermatitis, Hives and Rash   Tea     projectile vomiting   Wound Dressing Adhesive Other (See Comments)    Per patient skin irritation    History:   Past Medical History:  Diagnosis Date   Thyroid  disease      Past Surgical History:  Procedure Laterality Date   BRAIN SURGERY     KNEE SURGERY     THYROID  SURGERY      No family history on file.  Social History   Tobacco Use   Smoking status: Never   Smokeless tobacco: Never  Substance Use Topics   Alcohol use: No   Drug use: No    Review of Systems   Constitutional:  Negative for chills, fatigue and fever.  Gastrointestinal:  Negative for abdominal pain, nausea and vomiting.  Musculoskeletal:  Negative for myalgias.  Skin:  Positive for rash.     Objective:   Vitals: BP 121/81 (BP Location: Right Arm)   Pulse 88   Temp 98 F (36.7 C) (Oral)   Resp 18   SpO2 98%   Physical Exam Constitutional:      General: She is not in acute distress.    Appearance: Normal appearance. She is well-developed and normal weight. She is not ill-appearing or toxic-appearing.  HENT:     Head: Normocephalic and atraumatic.  Cardiovascular:     Rate and Rhythm: Normal rate and regular rhythm.     Heart sounds: Normal heart sounds.  Pulmonary:     Effort: Pulmonary effort is normal.     Breath sounds: Normal breath sounds.     Comments: Clear to auscultation bilaterally  Abdominal:     General: Bowel sounds are normal.     Palpations: Abdomen is soft.     Tenderness: There is no abdominal tenderness.  Skin:    General: Skin is warm and dry.     Findings: Rash present. Rash is macular and vesicular.     Comments: Patient has rash to her left neck that appears to be consistent with shingles.  Lesions are grouped vesicles on an erythematous base.  No warmth, erythema, discharge.  Nontender to palpation.  Neurological:     General: No focal deficit present.     Mental Status: She is alert.  Psychiatric:        Mood and Affect: Mood and affect normal.     Results:  Labs: No results found for this or any previous visit (from the past 24 hours).  Radiology: No results found.   UC Course/Treatments:  Procedures: Procedures   Medications Ordered in UC: Medications - No data to display   Assessment and Plan :     ICD-10-CM   1. Herpes zoster without complication  B02.9      Herpes zoster without complication Afebrile, nontoxic-appearing, NAD. VSS. DDX includes but not limited to: Shingles, contact dermatitis, atopic dermatitis,  folliculitis, impetigo Rash appears consistent with early onset shingles.  Valtrex 1000 mg every 8 hours was prescribed.  She is also given triamcinolone  cream 0.1% 1 application twice daily for pruritus. Strict ED precautions were given and patient verbalized understanding.  ED Discharge Orders          Ordered    valACYclovir (VALTREX) 1000 MG tablet  Every 8 hours        01/13/24 1445    triamcinolone  cream (KENALOG) 0.1 %  2 times daily        01/13/24 1445             I have reviewed the PDMP during this encounter.     Basilia Ulanda SQUIBB, PA-C 01/13/24 1449

## 2024-01-13 NOTE — ED Triage Notes (Signed)
 C/O itchy rash to left side of the neck for three days. Patient denies other symptoms. Denies fever or chills. Patient using hydrocortisone OTC.

## 2024-01-13 NOTE — Discharge Instructions (Addendum)
 Your rash appears consistent with shingles.  You have been prescribed Valtrex for it. This is an antiviral often used to treat shingles. Please take as directed.  I have also sent a steroid cream to your pharmacy to help with the itching.  I recommend you avoid pregnant women and unvaccinated children.  You could also try to keep the area covered when you have to go out in the public.

## 2024-02-11 ENCOUNTER — Encounter: Payer: Self-pay | Admitting: Emergency Medicine

## 2024-02-11 ENCOUNTER — Ambulatory Visit
Admission: EM | Admit: 2024-02-11 | Discharge: 2024-02-11 | Disposition: A | Discharge status: Home / Self Care | Attending: Family Medicine | Admitting: Family Medicine

## 2024-02-11 DIAGNOSIS — J4541 Moderate persistent asthma with (acute) exacerbation: Secondary | ICD-10-CM

## 2024-02-11 DIAGNOSIS — R0789 Other chest pain: Secondary | ICD-10-CM

## 2024-02-11 MED ORDER — IPRATROPIUM-ALBUTEROL 0.5-2.5 (3) MG/3ML IN SOLN
3.0000 mL | Freq: Once | RESPIRATORY_TRACT | Status: AC
  Administered 2024-02-11: 3 mL via RESPIRATORY_TRACT

## 2024-02-11 MED ORDER — TRELEGY ELLIPTA 200-62.5-25 MCG/ACT IN AEPB: 1.0000 | INHALATION_SPRAY | Freq: Every morning | RESPIRATORY_TRACT | 0 refills | Status: AC

## 2024-02-11 MED ORDER — METHYLPREDNISOLONE 4 MG PO TBPK: ORAL_TABLET | ORAL | 0 refills | Status: DC

## 2024-02-11 MED ORDER — ALBUTEROL SULFATE HFA 108 (90 BASE) MCG/ACT IN AERS: 2.0000 | INHALATION_SPRAY | Freq: Four times a day (QID) | RESPIRATORY_TRACT | 2 refills | Status: AC | PRN

## 2024-02-11 NOTE — Discharge Instructions (Addendum)
 Your EKG today is not concerning for any cardiac abnormalities.    Your symptoms and my physical exam findings are concerning for reactive airway disease with an acute exacerbation.  Reactive airways disease is when your lower airway becomes inflamed due to viral infection, bacterial infection or allergy exposure.   Please see the list below for recommended medications, dosages and frequencies to provide relief of your current symptoms:     Medrol  Dosepak (methylprednisolone ): This is a steroid that will significantly calm your upper and lower airways.  Please take one row of tablets daily with your breakfast meal starting tomorrow morning until the prescription is complete.     ProAir , Ventolin , Proventil  (albuterol ): This inhaled medication contains a short acting beta agonist bronchodilator.  This medication works on the smooth muscle that opens and constricts of your airways by relaxing the muscle.  The result of relaxation of the smooth muscle is increased air movement and improved work of breathing.  This is a short acting medication that can be used every 4-6 hours as needed for increased work of breathing, shortness of breath, wheezing and excessive coughing.     Trelegy (fluticasone, vilanterol and umeclidinium):  This daily inhaled, MAINTENANCE medication contains an inhaled corticosteroid (fluticasone), a muscarinic bronchodilator (umeclidinium) and a long-acting form of albuterol  (vilanterol).  It is exactly the same as Breo with the exception of the addition of the third ingredient, umeclidinium.  Unfortunately, Ranell is no longer preferred on your formulary but Trelegy is.  The inhaled steroid and this medication  is not absorbed into the body and will not cause side effects such as increased blood sugar levels, irritability, sleeplessness or weight gain.  Inhaled corticosteroids are sort of like topical steroid creams but, as you can imagine, it is not practical to attempt to rub a steroid  cream inside of your lungs.  The long-acting albuterol  and muscarinic bronchodilator works similarly to the short acting albuterol  and Spiriva (ipratropium) that are found in rescue inhalers.  They provide 24-hour relaxation of the smooth muscles that open and constrict your airways.  Short acting rescue inhalers can only provide this benefit for a few hours.  Please feel free to continue using albuterol  (your short acting rescue inhaler) as often as needed throughout the day for shortness of breath, wheezing, and cough.  I provided you with a manufactures coupon for cost savings with your copayment.  Please be sure to call the number on the coupon to activate it before you use it.  The coupon will expire on February 12, 2025.   Not taking your inhaled medications as prescribed can increase your risk of more frequent upper respiratory infections, lower respiratory disorders, skin reactions, and eye irritations that may or may not require the use of antibiotics and steroids and can result in loss of time at work, celebrations with family and friends as well as missed social opportunities.   Please follow-up with either your primary care provider or with urgent care for repeat evaluation you of your lungs in the next 3 to 4 days to ensure that you are improving and also to evaluate whether or not your treatment regimen needs to be adjusted.   Thank you for visiting urgent care today.  We appreciate the opportunity to participate in your care.

## 2024-02-11 NOTE — ED Triage Notes (Signed)
 Pt reports chest tightness x 3 days. Reports breathing makes the tightness worse. Reports hx of Asthma and Acid reflux. States she normally uses an inhaler when the chest tightness occurs but this time it didn't provide any relief.

## 2024-02-11 NOTE — ED Provider Notes (Addendum)
 Kendra [name]    CSN: [id number] Arrival date & time: 02/11/24  1329    HISTORY   Chief Complaint  Patient presents with   Chest Pain   HPI Kendra [name] is a pleasant, 58 y.o. female who presents to urgent care today. Patient complains of a 3-day history of tightness in her chest.  Patient states breathing make the tightness feel worse.  Patient states she has a history of asthma and acid reflux.  Patient states when her chest tightness occurs, she normally gets relief with her asthma inhaler however this time it is not providing her with any relief.  EMR reviewed by me, I do not see that patient has had a refill of either albuterol  or Breo in the past 5 years.   Chest Pain  Past Medical History:  Diagnosis Date   Thyroid  disease    There are no active problems to display for this patient.  Past Surgical History:  Procedure Laterality Date   BRAIN SURGERY     KNEE SURGERY     THYROID  SURGERY     OB History   No obstetric history on file.    Home Medications    Prior to Admission medications  Medication Sig Start Date End Date Taking? Authorizing Provider  fexofenadine (ALLEGRA ODT) 30 MG disintegrating tablet Take 30 mg by mouth daily.    [provider]  tirzepatide  (ZEPBOUND ) 7.5 MG/0.5ML Pen Inject 7.5 mg into the skin once a week. 01/04/24       Family History History reviewed. No pertinent family history. Social History Social History[1] Allergies   Benzonatate, Green tea leaf ext, Sulfamethoxazole-trimethoprim, Hydrocod poli-chlorphe poli er, Tea, and Wound dressing adhesive  Review of Systems Review of Systems  Cardiovascular:  Positive for chest pain.   Pertinent findings revealed after performing a 14 point review of systems has been noted in the history of present illness.  Physical Exam Vital Signs BP 117/79 (BP Location: Right Arm)   Pulse (!) 106   Temp 99.2 F (37.3 C) (Oral)   Resp 17   LMP 11/29/2015   SpO2 98%    No data found.  Physical Exam Vitals and nursing note reviewed.  Constitutional:      General: She is awake. She is not in acute distress.    Appearance: Normal appearance. She is well-developed and well-groomed. She is not ill-appearing.  HENT:     Head: Normocephalic and atraumatic.     Salivary Glands: Right salivary gland is not diffusely enlarged or tender. Left salivary gland is not diffusely enlarged or tender.     Right Ear: Hearing, tympanic membrane, ear canal and external ear normal.     Left Ear: Hearing, tympanic membrane, ear canal and external ear normal.     Nose: Nose normal.     Right Turbinates: Not enlarged, swollen or pale.     Left Turbinates: Not enlarged, swollen or pale.     Right Sinus: No maxillary sinus tenderness or frontal sinus tenderness.     Left Sinus: No maxillary sinus tenderness or frontal sinus tenderness.     Mouth/Throat:     Lips: Pink. No lesions.     Mouth: Mucous membranes are moist. No oral lesions.     Tongue: No lesions. Tongue does not deviate from midline.     Palate: No mass and lesions.     Pharynx: Oropharynx is clear. Uvula midline. No pharyngeal swelling, oropharyngeal exudate, posterior oropharyngeal erythema,  uvula swelling or postnasal drip.     Tonsils: No tonsillar exudate. 0 on the right. 0 on the left.  Eyes:     General: Lids are normal.        Right eye: No discharge.        Left eye: No discharge.     Conjunctiva/sclera: Conjunctivae normal.     Right eye: Right conjunctiva is not injected.     Left eye: Left conjunctiva is not injected.  Neck:     Trachea: Trachea and phonation normal.  Cardiovascular:     Rate and Rhythm: Normal rate and regular rhythm.  Pulmonary:     Effort: Pulmonary effort is normal. Prolonged expiration present.     Breath sounds: Examination of the right-upper field reveals decreased breath sounds. Examination of the left-upper field reveals decreased breath sounds. Examination of the  right-middle field reveals decreased breath sounds. Examination of the left-middle field reveals decreased breath sounds. Examination of the right-lower field reveals decreased breath sounds. Examination of the left-lower field reveals decreased breath sounds. Decreased breath sounds present.  Chest:     Chest wall: No tenderness.  Musculoskeletal:        General: Normal range of motion.     Cervical back: Full passive range of motion without pain, normal range of motion and neck supple. Normal range of motion.  Lymphadenopathy:     Cervical: No cervical adenopathy.  Skin:    General: Skin is warm and dry.     Findings: No erythema or rash.  Neurological:     General: No focal deficit present.     Mental Status: She is alert and oriented to person, place, and time. Mental status is at baseline.  Psychiatric:        Attention and Perception: Attention and perception normal.        Mood and Affect: Mood and affect normal.        Speech: Speech normal.        Behavior: Behavior normal. Behavior is cooperative.        Thought Content: Thought content normal.     Visual Acuity Right Eye Distance:   Left Eye Distance:   Bilateral Distance:    Right Eye Near:   Left Eye Near:    Bilateral Near:     [name] / Diagnostics / Procedures:     Radiology No results found.  Procedures ED EKG  Date/Time: 02/11/2024 2:26 PM  Performed by: Joesph Shaver Scales, PA-C Authorized by: Vonna Sharlet POUR, MD   Previous ECG:    Previous ECG:  Compared to current   Similarity:  No change Interpretation:    Interpretation: normal   Rate:    ECG rate assessment: normal   Rhythm:    Rhythm: sinus rhythm   QRS:    QRS axis:  Normal   QRS intervals:  Normal   QRS conduction: normal   ST segments:    ST segments:  Normal T waves:    T waves: normal   Q waves:    Abnormal Q-waves: not present   Comments:     Normal sinus rhythm, normal EKG.  (including critical care  time) EKG  Pending results:  Labs Reviewed - No data to display  Medications Ordered in [name]: Medications  ipratropium-albuterol  (DUONEB) 0.5-2.5 (3) MG/3ML nebulizer solution 3 mL (3 mLs Nebulization Given 02/11/24 1405)    [name] Diagnoses / Final Clinical Impressions(s)   I have reviewed the triage vital signs  and the nursing notes.  Pertinent labs & imaging results that were available during my care of the patient were reviewed by me and considered in my medical decision making (see chart for details).    Final diagnoses:  Moderate persistent asthma with acute exacerbation  Sensation of chest tightness   Patient advised of normal EKG findings.  Patient reported improved work of breathing after DuoNeb treatment during her visit today.  No wheeze, rale, rhonchi appreciated on exam.  Patient requesting refill of albuterol  and Breo.  Unfortunately, Breo longer appears to be first-line on her formulary, have provided her with a prescription for Trelegy instead.  Patient provided with education regarding maintenance inhalers versus rescue inhalers.  Patient also provided with a prescription for methylprednisolone  for relief of acute asthma exacerbation.  Conservative care recommended.  Return precautions advised.  Please see discharge instructions below for details of plan of care as provided to patient. ED Prescriptions     Medication Sig Dispense Auth. Provider   Fluticasone-Umeclidin-Vilant (TRELEGY ELLIPTA ) 200-62.5-25 MCG/ACT AEPB Inhale 1 puff into the lungs in the morning. 3 each Joesph Shaver Scales, PA-C   albuterol  (VENTOLIN  HFA) 108 (90 Base) MCG/ACT inhaler Inhale 2 puffs into the lungs every 6 (six) hours as needed for wheezing or shortness of breath (Cough). 18 g Joesph Shaver Scales, PA-C   methylPREDNISolone  (MEDROL  DOSEPAK) 4 MG TBPK tablet Take 24 mg on day 1, 20 mg on day 2, 16 mg on day 3, 12 mg on day 4, 8 mg on day 5, 4 mg on day 6.  Take all tablets in each row at once,  do not spread tablets out throughout the day. 21 tablet Joesph Shaver Scales, PA-C      PDMP not reviewed this encounter.    Discharge Instructions      Your EKG today is not concerning for any cardiac abnormalities.    Your symptoms and my physical exam findings are concerning for reactive airway disease with an acute exacerbation.  Reactive airways disease is when your lower airway becomes inflamed due to viral infection, bacterial infection or allergy exposure.   Please see the list below for recommended medications, dosages and frequencies to provide relief of your current symptoms:     Medrol  Dosepak (methylprednisolone ): This is a steroid that will significantly calm your upper and lower airways.  Please take one row of tablets daily with your breakfast meal starting tomorrow morning until the prescription is complete.     ProAir , Ventolin , Proventil  (albuterol ): This inhaled medication contains a short acting beta agonist bronchodilator.  This medication works on the smooth muscle that opens and constricts of your airways by relaxing the muscle.  The result of relaxation of the smooth muscle is increased air movement and improved work of breathing.  This is a short acting medication that can be used every 4-6 hours as needed for increased work of breathing, shortness of breath, wheezing and excessive coughing.     Trelegy (fluticasone, vilanterol and umeclidinium):  This daily inhaled, MAINTENANCE medication contains an inhaled corticosteroid (fluticasone), a muscarinic bronchodilator (umeclidinium) and a long-acting form of albuterol  (vilanterol).  It is exactly the same as Breo with the exception of the addition of the third ingredient, umeclidinium.  Unfortunately, Ranell is no longer preferred on your formulary but Trelegy is.  The inhaled steroid and this medication  is not absorbed into the body and will not cause side effects such as increased blood sugar levels, irritability,  sleeplessness or weight gain.  Inhaled corticosteroids are sort of like topical steroid creams but, as you can imagine, it is not practical to attempt to rub a steroid cream inside of your lungs.  The long-acting albuterol  and muscarinic bronchodilator works similarly to the short acting albuterol  and Spiriva (ipratropium) that are found in rescue inhalers.  They provide 24-hour relaxation of the smooth muscles that open and constrict your airways.  Short acting rescue inhalers can only provide this benefit for a few hours.  Please feel free to continue using albuterol  (your short acting rescue inhaler) as often as needed throughout the day for shortness of breath, wheezing, and cough.  I provided you with a manufactures coupon for cost savings with your copayment.  Please be sure to call the number on the coupon to activate it before you use it.  The coupon will expire on February 12, 2025.   Not taking your inhaled medications as prescribed can increase your risk of more frequent upper respiratory infections, lower respiratory disorders, skin reactions, and eye irritations that may or may not require the use of antibiotics and steroids and can result in loss of time at work, celebrations with family and friends as well as missed social opportunities.   Please follow-up with either your primary care provider or with urgent care for repeat evaluation you of your lungs in the next 3 to 4 days to ensure that you are improving and also to evaluate whether or not your treatment regimen needs to be adjusted.   Thank you for visiting urgent care today.  We appreciate the opportunity to participate in your care.       Disposition Upon Discharge:  Condition: stable for discharge home  Patient presented with an acute illness with associated systemic symptoms and significant discomfort requiring urgent management. In my opinion, this is a condition that a prudent lay person (someone who possesses an average  knowledge of health and medicine) may potentially expect to result in complications if not addressed urgently such as respiratory distress, impairment of bodily function or dysfunction of bodily organs.   Routine symptom specific, illness specific and/or disease specific instructions were discussed with the patient and/or caregiver at length.   As such, the patient has been evaluated and assessed, work-up was performed and treatment was provided in alignment with urgent care protocols and evidence based medicine.  Patient/parent/caregiver has been advised that the patient may require follow up for further testing and treatment if the symptoms continue in spite of treatment, as clinically indicated and appropriate.  Patient/parent/caregiver has been advised to return to the [facility] or PCP if no better; to PCP or the Emergency Department if new signs and symptoms develop, or if the current signs or symptoms continue to change or worsen for further workup, evaluation and treatment as clinically indicated and appropriate  The patient will follow up with their current PCP if and as advised. If the patient does not currently have a PCP we will assist them in obtaining one.   The patient may need specialty follow up if the symptoms continue, in spite of conservative treatment and management, for further workup, evaluation, consultation and treatment as clinically indicated and appropriate.  Patient/parent/caregiver verbalized understanding and agreement of plan as discussed.  All questions were addressed during visit.  Please see discharge instructions below for further details of plan.  This office note has been dictated using Teaching laboratory technician.  Unfortunately, this method of dictation can sometimes lead to typographical or grammatical errors.  I  apologize for your inconvenience in advance if this occurs.  Please do not hesitate to reach out to me if clarification is needed.      Joesph Shaver Scales, PA-C 02/11/24 1428     [1]  Social History Tobacco Use   Smoking status: Never   Smokeless tobacco: Never  Substance Use Topics   Alcohol use: No   Drug use: No     Joesph Shaver Scales, PA-C 02/11/24 1429  "

## 2024-02-12 ENCOUNTER — Other Ambulatory Visit: Payer: Self-pay

## 2024-02-12 ENCOUNTER — Other Ambulatory Visit (HOSPITAL_COMMUNITY): Payer: Self-pay

## 2024-02-18 ENCOUNTER — Other Ambulatory Visit: Payer: Self-pay

## 2024-02-18 ENCOUNTER — Ambulatory Visit (INDEPENDENT_AMBULATORY_CARE_PROVIDER_SITE_OTHER)

## 2024-02-18 ENCOUNTER — Ambulatory Visit
Admission: EM | Admit: 2024-02-18 | Discharge: 2024-02-18 | Disposition: A | Discharge status: Home / Self Care | Attending: Internal Medicine | Admitting: Internal Medicine

## 2024-02-18 DIAGNOSIS — R051 Acute cough: Secondary | ICD-10-CM | POA: Diagnosis not present

## 2024-02-18 DIAGNOSIS — R509 Fever, unspecified: Secondary | ICD-10-CM

## 2024-02-18 DIAGNOSIS — R6889 Other general symptoms and signs: Secondary | ICD-10-CM

## 2024-02-18 LAB — POCT INFLUENZA A/B
Influenza A, POC: NEGATIVE
Influenza B, POC: NEGATIVE

## 2024-02-18 LAB — POC SOFIA SARS ANTIGEN FIA: SARS Coronavirus 2 Ag: NEGATIVE

## 2024-02-18 MED ORDER — TRELEGY ELLIPTA 200-62.5-25 MCG/ACT IN AEPB: 1.0000 | INHALATION_SPRAY | Freq: Every morning | RESPIRATORY_TRACT | 0 refills | Status: AC

## 2024-02-18 NOTE — ED Triage Notes (Signed)
 C/O productive cough, nasal congestion, bilateral ear fullness and chest congestion for two days. C/O headache, body aches and fatigue. Patient is taking Coricidin for symptoms.

## 2024-02-18 NOTE — ED Provider Notes (Signed)
 " [name]    CSN: [id number] Arrival date & time: 02/18/24  0813      History   Chief Complaint Chief Complaint  Patient presents with   Cough    HPI Kendra [name] is a 59 y.o. female presents with onset of cough, rhinitis, bilateral ear fullness, congestion, HA, body and fatigue x 2 days. Her chest feels congested. Has been taking Coricidin. She just finished Medrol  dose back for asthma exacerbation when seen here 12/29. Her pharmacy has not received the Trelegy inhaler, would like me to resend it.     Past Medical History:  Diagnosis Date   Thyroid  disease     There are no active problems to display for this patient.   Past Surgical History:  Procedure Laterality Date   BRAIN SURGERY     KNEE SURGERY     THYROID  SURGERY      OB History   No obstetric history on file.      Home Medications    Prior to Admission medications  Medication Sig Start Date End Date Taking? Authorizing Provider  albuterol  (VENTOLIN  HFA) 108 (90 Base) MCG/ACT inhaler Inhale 2 puffs into the lungs every 6 (six) hours as needed for wheezing or shortness of breath (Cough). 02/11/24   Joesph Shaver Scales, PA-C  fexofenadine (ALLEGRA ODT) 30 MG disintegrating tablet Take 30 mg by mouth daily.    [provider]  Fluticasone-Umeclidin-Vilant (TRELEGY ELLIPTA ) 200-62.5-25 MCG/ACT AEPB Inhale 1 puff into the lungs in the morning. 02/11/24 05/11/24  Joesph Shaver Scales, PA-C  tirzepatide  (ZEPBOUND ) 7.5 MG/0.5ML Pen Inject 7.5 mg into the skin once a week. 01/04/24       Family History History reviewed. No pertinent family history.  Social History Social History[1]   Allergies   Benzonatate, Green tea leaf ext, Sulfamethoxazole-trimethoprim, Hydrocod poli-chlorphe poli er, Tea, and Wound dressing adhesive   Review of Systems Review of Systems As noted in HPI  Physical Exam Triage Vital Signs ED Triage Vitals  Encounter Vitals Group     BP 02/18/24 0824  131/85     Girls Systolic BP Percentile --      Girls Diastolic BP Percentile --      Boys Systolic BP Percentile --      Boys Diastolic BP Percentile --      Pulse Rate 02/18/24 0824 (!) 101     Resp 02/18/24 0824 18     Temp 02/18/24 0824 99.1 F (37.3 C)     Temp Source 02/18/24 0824 Oral     SpO2 02/18/24 0824 97 %     Weight --      Height --      Head Circumference --      Peak Flow --      Pain Score 02/18/24 0825 7     Pain Loc --      Pain Education --      Exclude from Growth Chart --    No data found.  Updated Vital Signs BP 131/85 (BP Location: Right Arm)   Pulse (!) 101   Temp 99.1 F (37.3 C) (Oral)   Resp 18   LMP 11/29/2015   SpO2 97%   Visual Acuity Right Eye Distance:   Left Eye Distance:   Bilateral Distance:    Right Eye Near:   Left Eye Near:    Bilateral Near:     Physical Exam Physical Exam Constitutional:      General: SHe is not in  acute distress.    Appearance: SHe is not toxic-appearing.  HENT:     Head: Normocephalic.     Right Ear: Tympanic membrane, ear canal and external ear normal.     Left Ear: Ear canal and external ear normal.     Nose: Nose normal.     Mouth/Throat:     Mouth: Mucous membranes are moist.     Pharynx: Oropharynx is clear.  Eyes:     General: No scleral icterus.    Conjunctiva/sclera: Conjunctivae normal.  Cardiovascular:     Rate and Rhythm: Normal rate and regular rhythm.     Heart sounds: No murmur heard.   Pulmonary:     Effort: Pulmonary effort is normal. No respiratory distress.     Breath sounds: clear with no wheezing present.  Musculoskeletal:        General: Normal range of motion.     Cervical back: Neck supple.  Lymphadenopathy:     Cervical: No cervical adenopathy.  Skin:    General: Skin is warm and dry.     Findings: No rash.  Neurological:     Mental Status: SHe is alert and oriented to person, place, and time.     Gait: Gait normal.  Psychiatric:        Mood and Affect: Mood  normal.        Behavior: Behavior normal.        Thought Content: Thought content normal.        Judgment: Judgment normal.    UC Treatments / Results  Labs (all labs ordered are listed, but only abnormal results are displayed) Labs Reviewed  POCT INFLUENZA A/B  POC SOFIA SARS ANTIGEN FIA   Flu and Covid tests are negative.   EKG   Radiology No results found.  Procedures Procedures (including critical care time)  Medications Ordered in UC Medications - No data to display  Initial Impression / Assessment and Plan / UC Course  I have reviewed the triage vital signs and the nursing notes.  Pertinent labs & imaging results that were available during my care of the patient were reviewed by me and considered in my medical decision making (see chart for details). Flu Covid and chest xray were negative  URI  May continue current medication and Muscinex prn.  Advised to use her albuterol  inhaler q 4-6 h for 3-5 days then prn.      Final Clinical Impressions(s) / UC Diagnoses   Final diagnoses:  Flu-like symptoms  Acute cough  Fever, unspecified   Discharge Instructions   None    ED Prescriptions   None    PDMP not reviewed this encounter.     [1]  Social History Tobacco Use   Smoking status: Never   Smokeless tobacco: Never  Substance Use Topics   Alcohol use: No   Drug use: No     Lindi Carter, PA-C 02/18/24 1039  "

## 2024-02-18 NOTE — Discharge Instructions (Addendum)
 Your Covid, Flu  and chest xray  are negative. You have some other virus causing this cold.  Use your albuterol  inhaler every 4-6 hours for 5 days, then after that as needed.   You have a viral cold for which you don't need antibiotics since it is not bacterial. Viruses are not treated with antibiotics. Normally colds last up to 3 weeks, improving around 9-10 days gradually. But if after 10 days you get a fever, sweats, shortness of breath, worse cough, or fever over 100.4, then you need to be seen again to evaluate and see if you have a secondary infection. To help your immune system fight this, you may take EmercenC. If you take cough suppressants only take them if the cough is bothersome. If you suppress your cough too much you may end up with bronchitis or pneumonia.   You may also try saline nose rinses for the post nasal drainage and nose congestion  twice a day for 5-7 days.   Results for orders placed or performed during the hospital encounter of 02/18/24  POCT Influenza A/B   Collection Time: 02/18/24  8:32 AM  Result Value Ref Range   Influenza A, POC Negative Negative   Influenza B, POC Negative Negative  POC SARS Coronavirus 2 Ag   Collection Time: 02/18/24  8:32 AM  Result Value Ref Range   SARS Coronavirus 2 Ag Negative Negative   EXAM: CHEST - 2 VIEW   COMPARISON:  10/18/2021   FINDINGS: Cardiomediastinal silhouette and pulmonary vasculature are within normal limits.   Lungs are clear.   IMPRESSION: No acute cardiopulmonary process.     Electronically Signed   By: Aliene Lloyd M.D.   On: 02/18/2024 09:07
